# Patient Record
Sex: Female | Born: 1975
Health system: Southern US, Community
[De-identification: ages and names within clinical notes are randomized; demographics above are authoritative.]

## PROBLEM LIST (undated history)

## (undated) DIAGNOSIS — J309 Allergic rhinitis, unspecified: Secondary | ICD-10-CM

## (undated) DIAGNOSIS — F40240 Claustrophobia: Secondary | ICD-10-CM

## (undated) DIAGNOSIS — B019 Varicella without complication: Secondary | ICD-10-CM

## (undated) DIAGNOSIS — E119 Type 2 diabetes mellitus without complications: Secondary | ICD-10-CM

## (undated) DIAGNOSIS — E1169 Type 2 diabetes mellitus with other specified complication: Secondary | ICD-10-CM

## (undated) DIAGNOSIS — I10 Essential (primary) hypertension: Secondary | ICD-10-CM

## (undated) DIAGNOSIS — E785 Hyperlipidemia, unspecified: Secondary | ICD-10-CM

## (undated) DIAGNOSIS — K219 Gastro-esophageal reflux disease without esophagitis: Secondary | ICD-10-CM

## (undated) HISTORY — DX: Essential (primary) hypertension: I10

## (undated) HISTORY — DX: Type 2 diabetes mellitus with other specified complication: E11.69

## (undated) HISTORY — DX: Varicella without complication: B01.9

## (undated) HISTORY — DX: Gastro-esophageal reflux disease without esophagitis: K21.9

## (undated) HISTORY — DX: Allergic rhinitis, unspecified: J30.9

## (undated) HISTORY — DX: Type 2 diabetes mellitus without complications: E11.9

## (undated) HISTORY — DX: Hyperlipidemia, unspecified: E78.5

---

## 1995-02-13 DIAGNOSIS — O24419 Gestational diabetes mellitus in pregnancy, unspecified control: Secondary | ICD-10-CM

## 1996-06-05 DIAGNOSIS — O24419 Gestational diabetes mellitus in pregnancy, unspecified control: Secondary | ICD-10-CM

## 2006-09-15 ENCOUNTER — Ambulatory Visit: Payer: Self-pay | Admitting: Internal Medicine

## 2006-11-06 HISTORY — PX: LUMBAR DISC SURGERY: SHX700

## 2006-11-08 ENCOUNTER — Ambulatory Visit (HOSPITAL_COMMUNITY): Admission: RE | Admit: 2006-11-08 | Discharge: 2006-11-09 | Payer: Self-pay | Admitting: Neurological Surgery

## 2008-07-21 ENCOUNTER — Ambulatory Visit: Payer: Self-pay | Admitting: Internal Medicine

## 2008-11-06 HISTORY — PX: FOOT SURGERY: SHX648

## 2009-03-09 ENCOUNTER — Ambulatory Visit: Payer: Self-pay | Admitting: Podiatry

## 2010-02-24 ENCOUNTER — Ambulatory Visit: Payer: Self-pay | Admitting: Unknown Physician Specialty

## 2011-05-29 ENCOUNTER — Ambulatory Visit: Payer: Self-pay | Admitting: Internal Medicine

## 2011-06-07 ENCOUNTER — Ambulatory Visit: Payer: Self-pay | Admitting: Internal Medicine

## 2012-03-24 ENCOUNTER — Emergency Department: Payer: Self-pay | Admitting: *Deleted

## 2012-03-24 LAB — URINALYSIS, COMPLETE
Bacteria: NONE SEEN
Blood: NEGATIVE
Glucose,UR: >=500 mg/dL (ref 0–75)
Leukocyte Esterase: NEGATIVE
Ph: 5 (ref 4.5–8.0)
RBC,UR: 2 /HPF (ref 0–5)

## 2012-03-24 LAB — COMPREHENSIVE METABOLIC PANEL
Alkaline Phosphatase: 76 U/L (ref 50–136)
Anion Gap: 15 (ref 7–16)
BUN: 14 mg/dL (ref 7–18)
Bilirubin,Total: 0.6 mg/dL (ref 0.2–1.0)
Chloride: 104 mmol/L (ref 98–107)
Co2: 20 mmol/L — ABNORMAL LOW (ref 21–32)
Creatinine: 0.68 mg/dL (ref 0.60–1.30)
EGFR (Non-African Amer.): >60
SGOT(AST): 19 U/L (ref 15–37)
SGPT (ALT): 42 U/L

## 2012-03-24 LAB — CBC
HGB: 16.4 g/dL — ABNORMAL HIGH (ref 12.0–16.0)
Platelet: 346 10*3/uL (ref 150–440)
RBC: 5.49 10*6/uL — ABNORMAL HIGH (ref 3.80–5.20)

## 2013-01-21 ENCOUNTER — Ambulatory Visit: Payer: Self-pay | Admitting: Internal Medicine

## 2013-01-24 ENCOUNTER — Ambulatory Visit: Payer: Self-pay | Admitting: Internal Medicine

## 2013-02-04 ENCOUNTER — Ambulatory Visit: Payer: Self-pay | Admitting: Internal Medicine

## 2013-03-11 ENCOUNTER — Ambulatory Visit: Payer: Self-pay | Admitting: Internal Medicine

## 2013-03-25 ENCOUNTER — Encounter: Payer: Self-pay | Admitting: General Practice

## 2013-04-06 ENCOUNTER — Ambulatory Visit: Payer: Self-pay | Admitting: Internal Medicine

## 2013-04-06 ENCOUNTER — Encounter: Payer: Self-pay | Admitting: General Practice

## 2015-02-15 ENCOUNTER — Encounter: Payer: Self-pay | Admitting: *Deleted

## 2015-09-27 ENCOUNTER — Other Ambulatory Visit: Payer: Self-pay | Admitting: Unknown Physician Specialty

## 2015-09-27 ENCOUNTER — Inpatient Hospital Stay
Admission: RE | Admit: 2015-09-27 | Discharge: 2015-09-27 | Disposition: A | Discharge status: Home / Self Care | Payer: Self-pay | Source: Ambulatory Visit | Attending: *Deleted | Admitting: *Deleted

## 2015-09-27 ENCOUNTER — Other Ambulatory Visit: Payer: Self-pay | Admitting: *Deleted

## 2015-09-27 DIAGNOSIS — R928 Other abnormal and inconclusive findings on diagnostic imaging of breast: Secondary | ICD-10-CM

## 2015-09-27 DIAGNOSIS — Z9289 Personal history of other medical treatment: Secondary | ICD-10-CM

## 2015-10-13 ENCOUNTER — Ambulatory Visit
Admission: RE | Admit: 2015-10-13 | Discharge: 2015-10-13 | Disposition: A | Discharge status: Home / Self Care | Payer: 59 | Source: Ambulatory Visit | Attending: Unknown Physician Specialty | Admitting: Unknown Physician Specialty

## 2015-10-13 DIAGNOSIS — N63 Unspecified lump in breast: Secondary | ICD-10-CM | POA: Diagnosis not present

## 2015-10-13 DIAGNOSIS — R928 Other abnormal and inconclusive findings on diagnostic imaging of breast: Secondary | ICD-10-CM

## 2015-12-20 ENCOUNTER — Ambulatory Visit (INDEPENDENT_AMBULATORY_CARE_PROVIDER_SITE_OTHER): Payer: 59 | Admitting: Family Medicine

## 2015-12-20 ENCOUNTER — Telehealth: Payer: Self-pay | Admitting: *Deleted

## 2015-12-20 ENCOUNTER — Encounter: Payer: Self-pay | Admitting: Family Medicine

## 2015-12-20 VITALS — BP 126/84 | HR 99 | Temp 98.2°F | Ht 64.5 in | Wt 157.4 lb

## 2015-12-20 DIAGNOSIS — D229 Melanocytic nevi, unspecified: Secondary | ICD-10-CM | POA: Diagnosis not present

## 2015-12-20 DIAGNOSIS — J069 Acute upper respiratory infection, unspecified: Secondary | ICD-10-CM

## 2015-12-20 DIAGNOSIS — R928 Other abnormal and inconclusive findings on diagnostic imaging of breast: Secondary | ICD-10-CM | POA: Diagnosis not present

## 2015-12-20 DIAGNOSIS — E119 Type 2 diabetes mellitus without complications: Secondary | ICD-10-CM

## 2015-12-20 MED ORDER — METFORMIN HCL 1000 MG PO TABS: 1000.0000 mg | ORAL_TABLET | Freq: Two times a day (BID) | ORAL | Status: DC

## 2015-12-20 MED ORDER — PANTOPRAZOLE SODIUM 40 MG PO TBEC: 40.0000 mg | DELAYED_RELEASE_TABLET | Freq: Every day | ORAL | Status: DC

## 2015-12-20 MED ORDER — GLIPIZIDE 10 MG PO TABS: 10.0000 mg | ORAL_TABLET | Freq: Two times a day (BID) | ORAL | Status: AC

## 2015-12-20 MED ORDER — LISINOPRIL 10 MG PO TABS: 10.0000 mg | ORAL_TABLET | Freq: Every day | ORAL | Status: DC

## 2015-12-20 NOTE — Assessment & Plan Note (Signed)
Nevus on abdomen is benign appearing. Advised patient of this. She'll continue to monitor. If it changes she will let us know.

## 2015-12-20 NOTE — Telephone Encounter (Signed)
Resent correct amount

## 2015-12-20 NOTE — Progress Notes (Signed)
Pre visit review using our clinic review tool, if applicable. No additional management support is needed unless otherwise documented below in the visit note. 

## 2015-12-20 NOTE — Patient Instructions (Signed)
Nice to meet you. Please continue to check her blood sugars fasting and with one meal per day. We will check her A1c tomorrow for fasting labs with a cholesterol panel as well. If your blood sugar runs 350 or greater please let us know immediately. You can do Zyrtec, Flonase, Tylenol, or ibuprofen and fluids for your upper respiratory infection. Please monitor the mole on her stomach and if it changes at all please let us know immediately.

## 2015-12-20 NOTE — Assessment & Plan Note (Signed)
Home CBGs not well controlled. Currently taking oral medications glipizide and metformin. His been unable to tolerate other orals and several injectables. We will check an A1c and a CMP today. We will base our decision on what to try next off of her A1c. Discussed that insulin would be a very real possibility for her. She will continue to check her CBGs at home.

## 2015-12-20 NOTE — Telephone Encounter (Signed)
[facility] pharmacy requested clarification for patients Rx for metformin (GLUCOPHAGE) 1000 MG tablet  Rx was sent over for 2x a day 30 count per fill Pharmacy  stated it will need to be 2x a day 60 count per fill

## 2015-12-20 NOTE — Progress Notes (Signed)
Patient ID: [name], female   DOB: 26-Aug-1976, 40 y.o.   MRN: [id number]  Tonya Rumps, MD Phone: [phone]  Tonya Sampson is a 40 y.o. female who presents today for new patient visit.  Viral upper respiratory infection: Patient notes starting yesterday with sore throat, sneezing, nonproductive cough, and nasal congestion. No fevers, chest pain, or shortness of breath. Son with similar symptoms. No medications tried.  Mole: Patient notes mole on her right anterior abdomen the last 6 months. Notes it is reddish in color. Has not changed in size or shape.  DIABETES Disease Monitoring: Blood Sugar ranges-180s-200s, if does not take any medication will go up to 460 Polyuria/phagia/dipsia- notes polyuria and polydipsia       Medications: Compliance- glipizide 10 mg twice a day and metformin 1000 mg twice a day Hypoglycemic symptoms- no  patient notes she has tried multiple injectable medications in the past. Has tried bidureon and Victoza though had nausea with both of these. Invokana gave her yeast infection. She thinks she has been on Januvia previously as well. Last A1c was 6 months ago she notes it was somewhere between 9 and 10.  Patient also notes she had a recent mammogram that had an abnormality in the lateral aspect of her right breast that was felt to be cystic in nature and there is a plan in place to recheck a mammogram in 6 months.   Active Ambulatory Problems    Diagnosis Date Noted  . Viral upper respiratory infection 12/20/2015  . Diabetes ([place]) 12/20/2015  . Benign nevus 12/20/2015  . Abnormal mammogram 12/20/2015   Resolved Ambulatory Problems    Diagnosis Date Noted  . No Resolved Ambulatory Problems   Past Medical History  Diagnosis Date  . Chickenpox   . GERD (gastroesophageal reflux disease)   . Allergic rhinitis     Family History  Problem Relation Age of Onset  . Alcoholism    . Arthritis    . Colon cancer    . Uterine cancer    .  Hyperlipidemia    . Heart disease    . Kidney disease    . Hypertension    . Diabetes      Social History   Social History  . Marital Status: Married    Spouse Name: N/A  . Number of Children: N/A  . Years of Education: N/A   Occupational History  . Not on file.   Social History Main Topics  . Smoking status: Never Smoker   . Smokeless tobacco: Not on file  . Alcohol Use: No  . Drug Use: No  . Sexual Activity: Not on file   Other Topics Concern  . Not on file   Social History Narrative    ROS   General:  Negative for nexplained weight loss, fever Skin: Positive for new or changing mole, negative sore that won't heal HEENT: Negative for trouble hearing, trouble seeing, ringing in ears, mouth sores, hoarseness, change in voice, dysphagia. CV:  Negative for chest pain, dyspnea, edema, palpitations Resp: Negative for cough, dyspnea, hemoptysis GI: Negative for nausea, vomiting, diarrhea, constipation, abdominal pain, melena, hematochezia. GU: Positive for frequent urination, lump in breast, Negative for dysuria, incontinence, urinary hesitance, hematuria, vaginal or penile discharge, polyuria, sexual difficulty MSK: Negative for muscle cramps or aches, joint pain or swelling Neuro: Negative for headaches, weakness, numbness, dizziness, passing out/fainting Psych: Negative for depression, anxiety, memory problems  Objective  Physical Exam Filed Vitals:   12/20/15 1428  BP: 126/84  Pulse: 99  Temp: 98.2 F (36.8 C)    BP Readings from Last 3 Encounters:  12/20/15 126/84   Wt Readings from Last 3 Encounters:  12/20/15 157 lb 6.4 oz (71.396 kg)    Physical Exam  Constitutional: She is well-developed, well-nourished, and in no distress.  HENT:  Head: Normocephalic and atraumatic.  Right Ear: External ear normal.  Left Ear: External ear normal.  Mouth/Throat: Oropharynx is clear and moist. No oropharyngeal exudate.  Normal TMs bilaterally  Eyes:  Conjunctivae are normal. Pupils are equal, round, and reactive to light.  Neck: Neck supple.  Cardiovascular: Normal rate, regular rhythm and normal heart sounds.  Exam reveals no gallop and no friction rub.   No murmur heard. Pulmonary/Chest: Effort normal and breath sounds normal. No respiratory distress. She has no wheezes. She has no rales.  Abdominal: Soft. Bowel sounds are normal. She exhibits no distension. There is no tenderness. There is no rebound and no guarding.  Musculoskeletal: She exhibits no edema.  Lymphadenopathy:    She has no cervical adenopathy.  Neurological: She is alert. Gait normal.  Skin: Skin is warm and dry. She is not diaphoretic.  Right anterior lower abdomen with 3 mm benign-appearing nevus with mild red/brown hue, no irregularity, uniform color  Psychiatric: Mood normal.   bilateral feet warm and well-perfused, no sores or ulcerations, monofilament testing intact   Assessment/Plan:   Viral upper respiratory infection Symptoms most consistent with viral upper respiratory infection. Discussed supportive care. She was advised on Flonase and Claritin. She'll continue to monitor. Given return precautions.  Diabetes ([place]) Home CBGs not well controlled. Currently taking oral medications glipizide and metformin. His been unable to tolerate other orals and several injectables. We will check an A1c and a CMP today. We will base our decision on what to try next off of her A1c. Discussed that insulin would be a very real possibility for her. She will continue to check her CBGs at home.  Benign nevus Nevus on abdomen is benign appearing. Advised patient of this. She'll continue to monitor. If it changes she will let us know.  Abnormal mammogram Mammogram findings felt to be cysts based on review of radiology read. Plan for mammography in 6 months to reevaluate.    Orders Placed This Encounter  Procedures  . Lipid Profile    Standing Status: Future     Number of  Occurrences:      Standing Expiration Date: 12/19/2016  . Comp Met (CMET)    Standing Status: Future     Number of Occurrences:      Standing Expiration Date: 12/19/2016  . HgB A1c    Standing Status: Future     Number of Occurrences:      Standing Expiration Date: 12/19/2016     Tonya Sampson

## 2015-12-20 NOTE — Assessment & Plan Note (Signed)
Mammogram findings felt to be cysts based on review of radiology read. Plan for mammography in 6 months to reevaluate.

## 2015-12-20 NOTE — Assessment & Plan Note (Signed)
Symptoms most consistent with viral upper respiratory infection. Discussed supportive care. She was advised on Flonase and Claritin. She'll continue to monitor. Given return precautions.

## 2015-12-28 ENCOUNTER — Other Ambulatory Visit: Payer: 59

## 2016-01-19 DIAGNOSIS — I10 Essential (primary) hypertension: Secondary | ICD-10-CM | POA: Diagnosis not present

## 2016-01-19 DIAGNOSIS — E119 Type 2 diabetes mellitus without complications: Secondary | ICD-10-CM | POA: Diagnosis not present

## 2016-01-19 DIAGNOSIS — Z0000 Encounter for general adult medical examination without abnormal findings: Secondary | ICD-10-CM | POA: Diagnosis not present

## 2016-01-20 ENCOUNTER — Ambulatory Visit: Payer: 59 | Admitting: Family Medicine

## 2016-01-26 DIAGNOSIS — E782 Mixed hyperlipidemia: Secondary | ICD-10-CM | POA: Diagnosis not present

## 2016-01-26 DIAGNOSIS — I10 Essential (primary) hypertension: Secondary | ICD-10-CM | POA: Diagnosis not present

## 2016-01-26 DIAGNOSIS — Z794 Long term (current) use of insulin: Secondary | ICD-10-CM | POA: Diagnosis not present

## 2016-01-26 DIAGNOSIS — E119 Type 2 diabetes mellitus without complications: Secondary | ICD-10-CM | POA: Diagnosis not present

## 2016-04-27 DIAGNOSIS — Z794 Long term (current) use of insulin: Secondary | ICD-10-CM | POA: Diagnosis not present

## 2016-04-27 DIAGNOSIS — E119 Type 2 diabetes mellitus without complications: Secondary | ICD-10-CM | POA: Diagnosis not present

## 2016-04-27 DIAGNOSIS — I10 Essential (primary) hypertension: Secondary | ICD-10-CM | POA: Diagnosis not present

## 2016-07-28 ENCOUNTER — Other Ambulatory Visit: Payer: Self-pay | Admitting: Certified Nurse Midwife

## 2016-07-28 DIAGNOSIS — N6310 Unspecified lump in the right breast, unspecified quadrant: Secondary | ICD-10-CM

## 2016-08-18 DIAGNOSIS — Z0000 Encounter for general adult medical examination without abnormal findings: Secondary | ICD-10-CM | POA: Diagnosis not present

## 2016-08-24 DIAGNOSIS — K219 Gastro-esophageal reflux disease without esophagitis: Secondary | ICD-10-CM | POA: Diagnosis not present

## 2016-08-24 DIAGNOSIS — E119 Type 2 diabetes mellitus without complications: Secondary | ICD-10-CM | POA: Diagnosis not present

## 2016-08-24 DIAGNOSIS — Z794 Long term (current) use of insulin: Secondary | ICD-10-CM | POA: Diagnosis not present

## 2016-08-24 DIAGNOSIS — I10 Essential (primary) hypertension: Secondary | ICD-10-CM | POA: Diagnosis not present

## 2016-08-24 DIAGNOSIS — K029 Dental caries, unspecified: Secondary | ICD-10-CM | POA: Diagnosis not present

## 2016-09-21 DIAGNOSIS — Z01419 Encounter for gynecological examination (general) (routine) without abnormal findings: Secondary | ICD-10-CM | POA: Diagnosis not present

## 2016-09-21 DIAGNOSIS — E113293 Type 2 diabetes mellitus with mild nonproliferative diabetic retinopathy without macular edema, bilateral: Secondary | ICD-10-CM | POA: Diagnosis not present

## 2016-09-21 DIAGNOSIS — N920 Excessive and frequent menstruation with regular cycle: Secondary | ICD-10-CM | POA: Diagnosis not present

## 2016-09-21 DIAGNOSIS — Z124 Encounter for screening for malignant neoplasm of cervix: Secondary | ICD-10-CM | POA: Diagnosis not present

## 2016-09-27 ENCOUNTER — Ambulatory Visit
Admission: RE | Admit: 2016-09-27 | Discharge: 2016-09-27 | Disposition: A | Discharge status: Home / Self Care | Payer: 59 | Source: Ambulatory Visit | Attending: Certified Nurse Midwife | Admitting: Certified Nurse Midwife

## 2016-09-27 DIAGNOSIS — R928 Other abnormal and inconclusive findings on diagnostic imaging of breast: Secondary | ICD-10-CM | POA: Diagnosis not present

## 2016-09-27 DIAGNOSIS — N6489 Other specified disorders of breast: Secondary | ICD-10-CM | POA: Diagnosis not present

## 2016-09-27 DIAGNOSIS — N6310 Unspecified lump in the right breast, unspecified quadrant: Secondary | ICD-10-CM

## 2016-12-22 DIAGNOSIS — I10 Essential (primary) hypertension: Secondary | ICD-10-CM | POA: Diagnosis not present

## 2016-12-22 DIAGNOSIS — K029 Dental caries, unspecified: Secondary | ICD-10-CM | POA: Diagnosis not present

## 2016-12-22 DIAGNOSIS — E119 Type 2 diabetes mellitus without complications: Secondary | ICD-10-CM | POA: Diagnosis not present

## 2016-12-22 DIAGNOSIS — Z794 Long term (current) use of insulin: Secondary | ICD-10-CM | POA: Diagnosis not present

## 2016-12-26 DIAGNOSIS — I10 Essential (primary) hypertension: Secondary | ICD-10-CM | POA: Diagnosis not present

## 2016-12-26 DIAGNOSIS — K219 Gastro-esophageal reflux disease without esophagitis: Secondary | ICD-10-CM | POA: Diagnosis not present

## 2016-12-26 DIAGNOSIS — K029 Dental caries, unspecified: Secondary | ICD-10-CM | POA: Diagnosis not present

## 2016-12-26 DIAGNOSIS — E119 Type 2 diabetes mellitus without complications: Secondary | ICD-10-CM | POA: Diagnosis not present

## 2017-02-10 IMAGING — MG MM DIGITAL DIAGNOSTIC BILAT W/ TOMO W/ CAD
8 of 13 series · 8 of 29 positions shown · non-contrast
Comparison: Previous exam(s).

CLINICAL DATA: Delayed follow-up for a probably benign right breast
mass.

EXAM:
2D DIGITAL DIAGNOSTIC BILATERAL MAMMOGRAM WITH CAD AND ADJUNCT TOMO
ULTRASOUND RIGHT BREAST

[R MLO (1 of 2)]
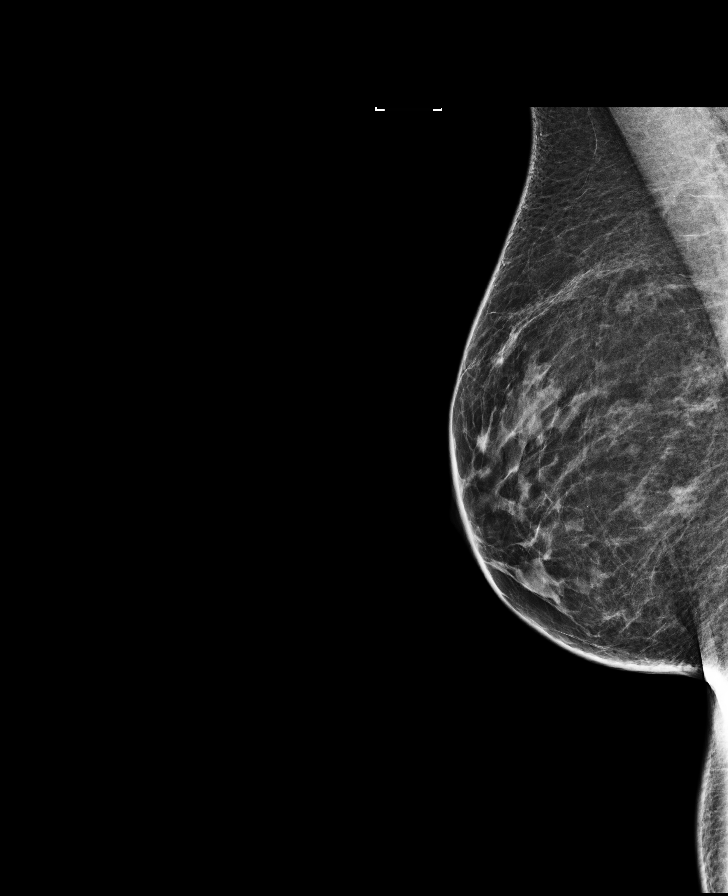

[L CC]
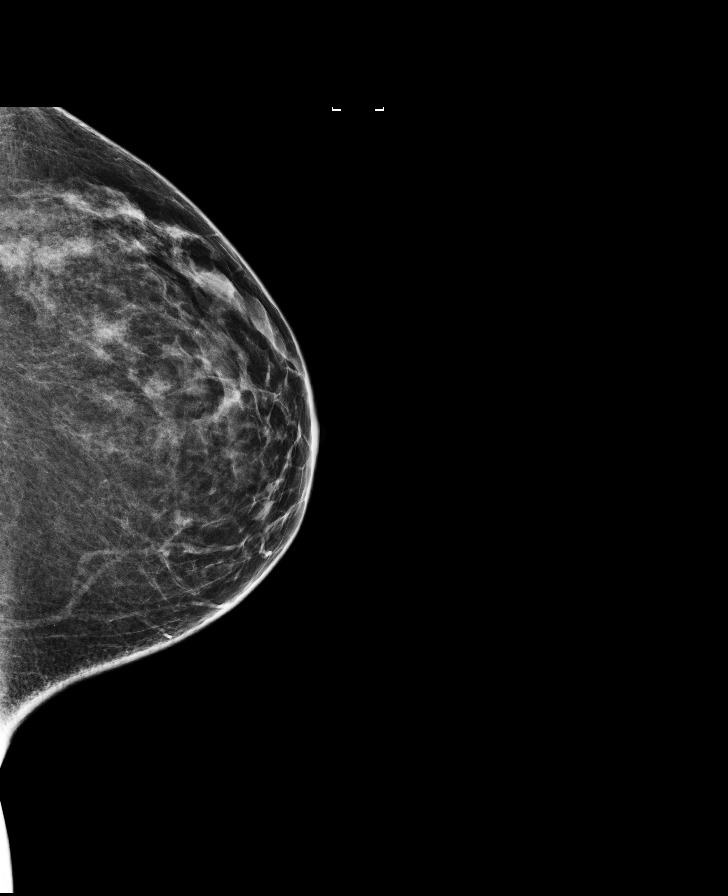

[L CC synth-2D]
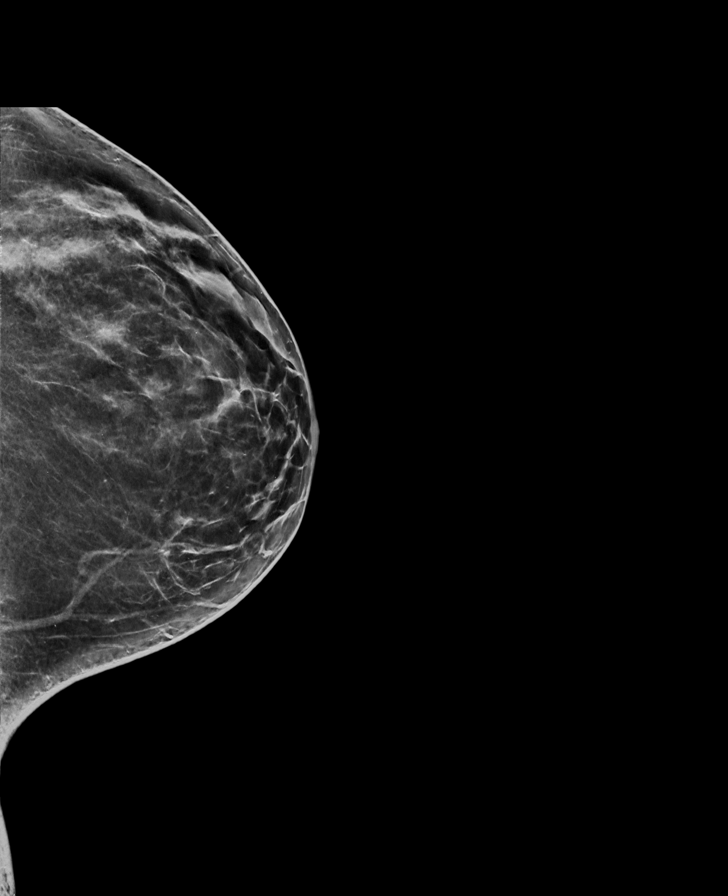

[R CC synth-2D]
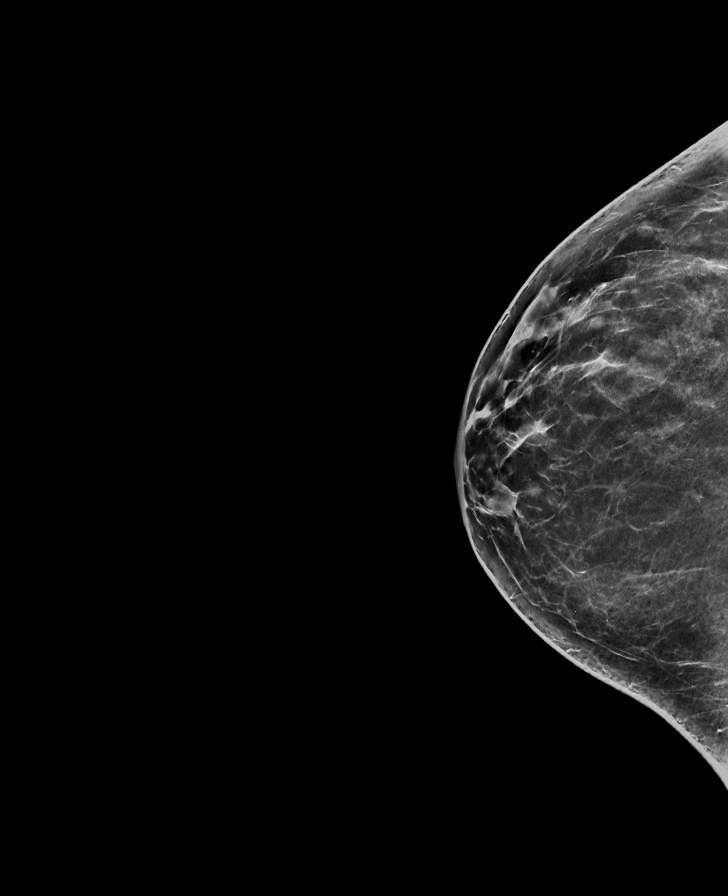

[R MLO (2 of 2)]
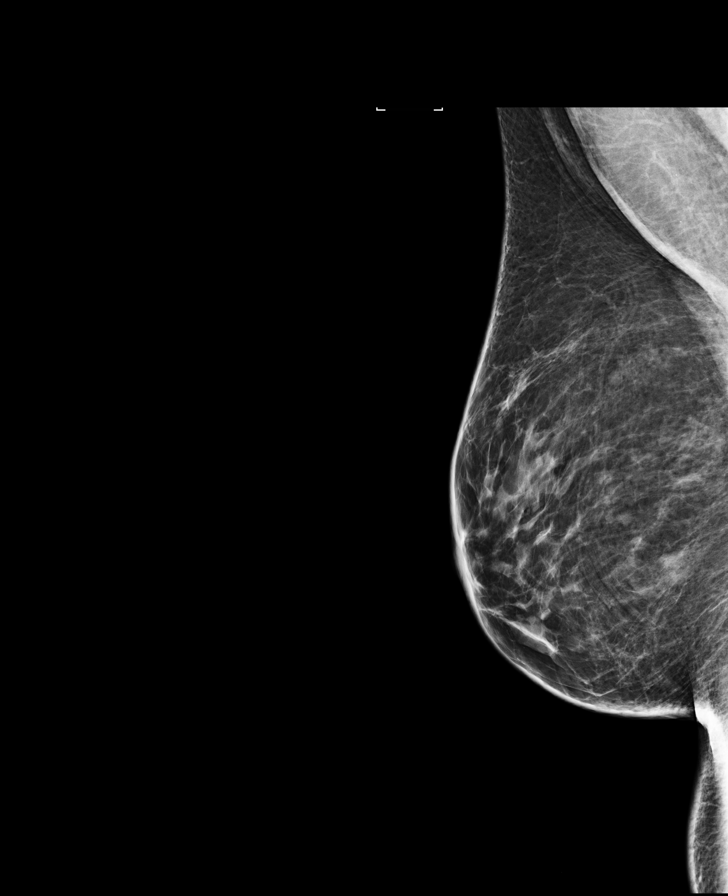

[R MLO synth-2D]
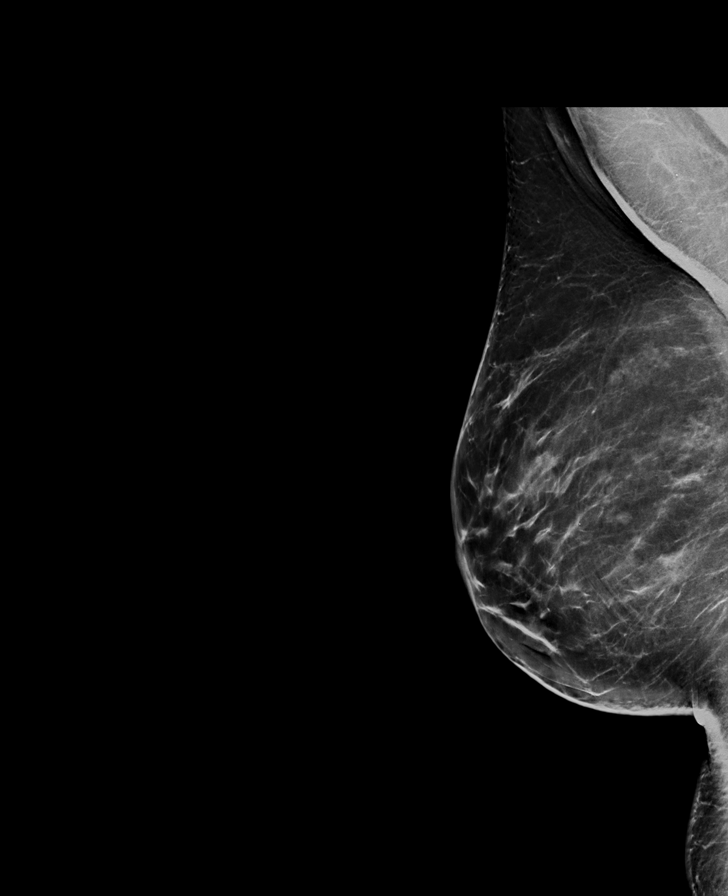

[R CC]
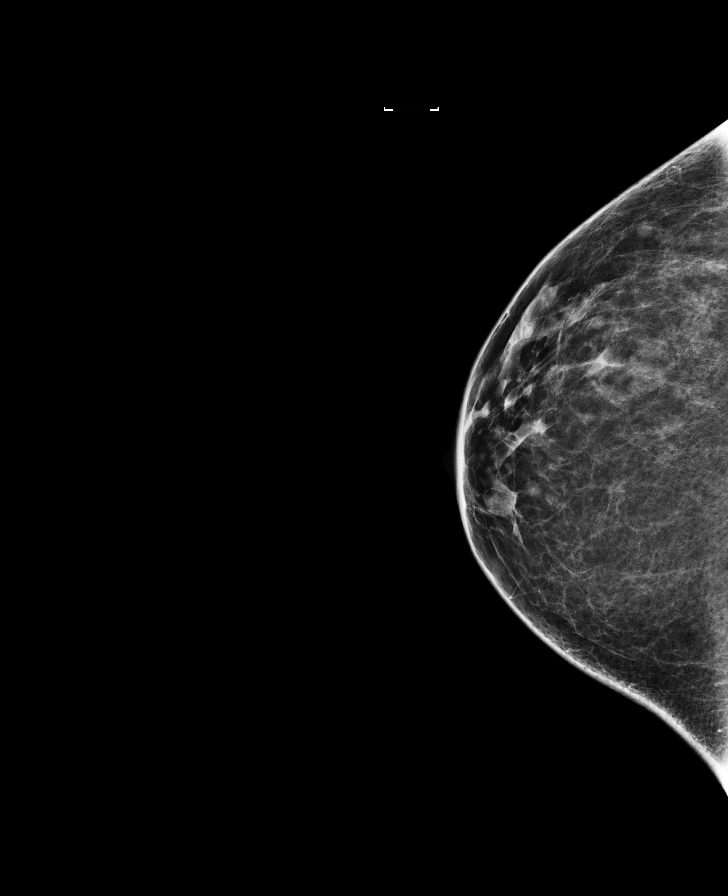

[L MLO]
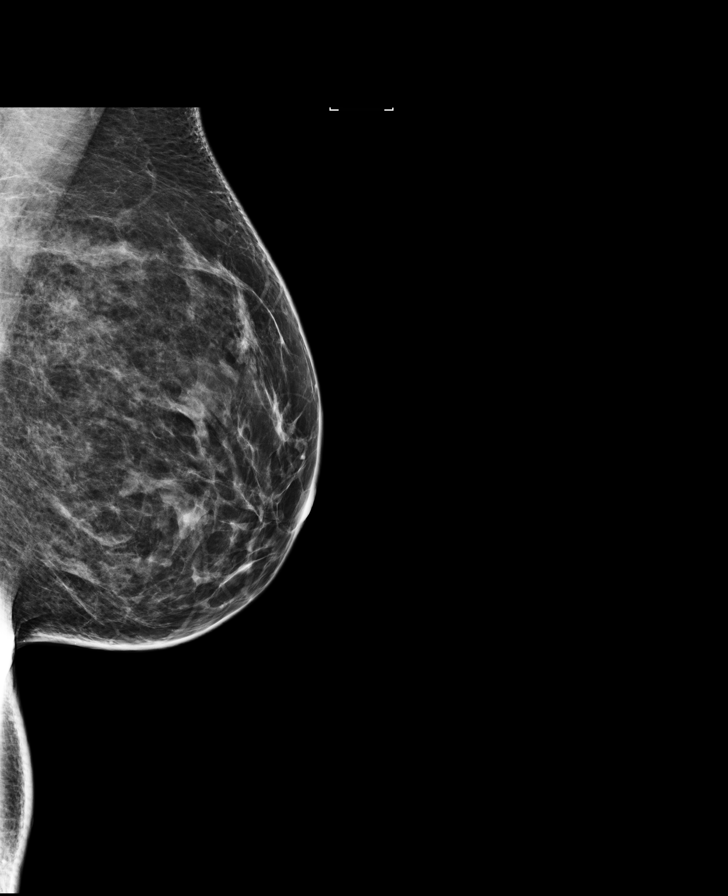

[8 of 29 positions shown; findings below may reference images not displayed]

ACR Breast Density Category c: The breast tissue is heterogeneously
dense, which may obscure small masses.
FINDINGS: The previously noted 5 mm oval, circumscribed, low-density mass
within the lower, outer right breast is not significantly changed
from the 09/21/2015 exam. No new or suspicious abnormality is
identified within either breast.

Mammographic images were processed with CAD.

Targeted ultrasound is performed demonstrating an oval,
circumscribed, hypoechoic mass at 8 o'clock, 6 cm from the nipple
measuring 5 x 2 x 4 mm, similar to slightly smaller in size given
differences in measurement technique. This mass is again thought to
likely represent a small complicated cyst.
IMPRESSION: Probably benign right breast finding.

RECOMMENDATION:
Bilateral diagnostic mammogram and possible right breast ultrasound
in 1 year.

I have discussed the findings and recommendations with the patient.
Results were also provided in writing at the conclusion of the
visit. If applicable, a reminder letter will be sent to the patient
regarding the next appointment.

BI-RADS CATEGORY  3: Probably benign.

## 2017-03-21 DIAGNOSIS — H40053 Ocular hypertension, bilateral: Secondary | ICD-10-CM | POA: Diagnosis not present

## 2017-03-27 DIAGNOSIS — E113293 Type 2 diabetes mellitus with mild nonproliferative diabetic retinopathy without macular edema, bilateral: Secondary | ICD-10-CM | POA: Diagnosis not present

## 2017-05-28 ENCOUNTER — Other Ambulatory Visit: Payer: Self-pay

## 2017-05-28 VITALS — BP 118/70 | Ht 63.0 in | Wt 159.3 lb

## 2017-05-28 DIAGNOSIS — Z794 Long term (current) use of insulin: Principal | ICD-10-CM

## 2017-05-28 DIAGNOSIS — E1165 Type 2 diabetes mellitus with hyperglycemia: Secondary | ICD-10-CM

## 2017-05-28 NOTE — Patient Outreach (Signed)
[facility]) Care Management  [place]  05/28/2017   Tonya Sampson November 21, 1975 [id number]  Subjective: Met with patient for her Link to Wellness diabetes visit through [facility].  Her last A1C was 10.4% in February 2018.  She has had to cancel follow up visits with her MD because she forgets to get her labs drawn before the appointment.  She is exercising 2x/week for 15-20 minutes.  She is taking her medications as ordered.  She denies SOB or chest pain. She reports fasting blood sugars 113-119 mg/dl (this morning's CBG was 155m/dl) and she tells me that her after supper blood sugars are 200 mg/dl.  She is taking 4-6 units of rapid acting insulin with one meal per day.   Objective:  Vitals:   05/28/17 1506  BP: 118/70  Weight: 159 lb 4.8 oz (72.3 kg)  Height: 1.6 m (5' 3" )    Encounter Medications:  Outpatient Encounter Prescriptions as of 05/28/2017  Medication Sig Note  . glipiZIDE (GLUCOTROL) 10 MG tablet Take 1 tablet (10 mg total) by mouth 2 (two) times daily before a meal.   . insulin detemir (LEVEMIR) 100 UNIT/ML injection Inject 30 Units into the skin at bedtime.   . liraglutide 18 MG/3ML SOPN Inject 1.8 mg into the skin daily.   .Marland Kitchenlisinopril (PRINIVIL,ZESTRIL) 10 MG tablet Take 1 tablet (10 mg total) by mouth daily.   . metFORMIN (GLUCOPHAGE) 1000 MG tablet Take 1 tablet (1,000 mg total) by mouth 2 (two) times daily with a meal.   . ondansetron (ZOFRAN) 4 MG tablet Take 4 mg by mouth every 8 (eight) hours as needed.   . pantoprazole (PROTONIX) 40 MG tablet Take 1 tablet (40 mg total) by mouth daily.   . Promethazine HCl (PHENERGAN PO) Take 25 mg by mouth every 6 (six) hours.   .Marland Kitchenglucose blood (ONE TOUCH ULTRA TEST) test strip  12/20/2015: Received from: [facility]  No facility-administered encounter medications on file as of 05/28/2017.     Functional Status:  In your present state of health, do you have any difficulty  performing the following activities: 05/28/2017  Hearing? N  Vision? N  Difficulty concentrating or making decisions? N  Walking or climbing stairs? N  Dressing or bathing? N  Doing errands, shopping? N  Some recent data might be hidden    Fall/Depression Screening: Fall Risk  05/28/2017  Falls in the past year? No   PHQ 2/9 Scores 05/28/2017  PHQ - 2 Score 0    Assessment: Tonya Sampson and I discussed the importance of taking care of her diabetes for long term prevention of diabetes complications.  She admits to having difficulty making lifestyle changes because "I don't feel sick so it's easy to "hide" it and not take care of myself".  We reviewed meal planning and the improtance of consistent exercise- aiming for 150 minutes per week. She wants to lose weight so encouraged weight watchers or calorie counting to achieve this- I have encouraged her to eat protein with carbohydrates for blood sugar management.    Plan:  [facility]     Most Recent Value  Care Plan Problem One  Elevated A1C greater than 7% as recommended by ADA  Role Documenting the Problem One  Care Management CJunction Cityfor Problem One  Active  [facility]   Patient will obtain an A1C less than 10.4% when she sees the MD within  3 months  THN Long Term Goal Start Date  05/28/17  Interventions for Problem One Long Term Goal  1. exercise 3x/week for 30 minutes after eating  2. take medications as ordered      Tonya Sampson needs to; 1. reschedule MD visit before 07/02/17 2. Try a non-medicated corn pad on her right baby toe where it is rubbing on her shoe 3. Call me with the name of her mealtime insulin (she doesn't know what she's taking) 4. Follow up with me on 07/02/17 5. Exercise 3x/week for 30 minutes after eating a meal 6. Complete EMMI education   Gentry Fitz, RN, [place], [place], [place] Diabetes Coordinator- Link To Wellness Direct Dial:  [phone]  Fax:   [phone] E-mail: [email or web address] [address], [place], [place]  [address]

## 2017-06-05 DIAGNOSIS — I10 Essential (primary) hypertension: Secondary | ICD-10-CM | POA: Diagnosis not present

## 2017-06-05 DIAGNOSIS — Z794 Long term (current) use of insulin: Secondary | ICD-10-CM | POA: Diagnosis not present

## 2017-06-05 DIAGNOSIS — E119 Type 2 diabetes mellitus without complications: Secondary | ICD-10-CM | POA: Diagnosis not present

## 2017-07-02 ENCOUNTER — Other Ambulatory Visit: Payer: Self-pay

## 2017-07-02 VITALS — BP 122/80 | Wt 158.9 lb

## 2017-07-02 DIAGNOSIS — Z794 Long term (current) use of insulin: Principal | ICD-10-CM

## 2017-07-02 DIAGNOSIS — E1165 Type 2 diabetes mellitus with hyperglycemia: Secondary | ICD-10-CM

## 2017-07-02 NOTE — Patient Outreach (Signed)
[place] [facility]) Care Management  [place]  07/02/2017   Tonya Sampson 27-Jul-1976 [id number]  Subjective: Met with Jenny Reichmann for her regularly scheduled Link to Wellness diabetes visit. Her last A1C was 9.3%, down from 10.4% in February.  She attributes the reduction in the A1C to her moving the Levemir to the hs because she was occasionally forgetting to take it in the morning.  She reports that she only checks blood sugars in the morning (150-126m/dl). She continue to take Levemir 30 units qhs. She tells me she was instructed to increase the Levemir 1 unit each day until the fasting blood sugars are less than 1343mdl but she has not done this. She is not taking any rapid acting insulin.  She is consistently taking her Victoza, Metformin and Glipizide. She denies chest pain or SOB but complains of chronic blurry vision. She has purchased new shoes and has decreased redness on her right baby toe.   Objective:  Vitals:   07/02/17 1710  BP: 122/80  Weight: 158 lb 14.4 oz (72.1 kg)     Encounter Medications:  Outpatient Encounter Prescriptions as of 07/02/2017  Medication Sig Note  . glipiZIDE (GLUCOTROL) 10 MG tablet Take 1 tablet (10 mg total) by mouth 2 (two) times daily before a meal.   . insulin detemir (LEVEMIR) 100 UNIT/ML injection Inject 30 Units into the skin at bedtime.   . liraglutide 18 MG/3ML SOPN Inject 1.8 mg into the skin daily.   . Marland Kitchenisinopril (PRINIVIL,ZESTRIL) 10 MG tablet Take 1 tablet (10 mg total) by mouth daily.   . metFORMIN (GLUCOPHAGE) 1000 MG tablet Take 1 tablet (1,000 mg total) by mouth 2 (two) times daily with a meal.   . pantoprazole (PROTONIX) 40 MG tablet Take 1 tablet (40 mg total) by mouth daily. (Patient taking differently: Take 40 mg by mouth 2 (two) times daily. )   . glucose blood (ONE TOUCH ULTRA TEST) test strip  12/20/2015: [facility]. ondansetron (ZOFRAN) 4 MG tablet Take 4 mg by mouth every 8  (eight) hours as needed.   . Promethazine HCl (PHENERGAN PO) Take 25 mg by mouth every 6 (six) hours.    No facility-administered encounter medications on file as of 07/02/2017.     Functional Status:  In your present state of health, do you have any difficulty performing the following activities: 05/28/2017  Hearing? N  Vision? N  Comment wears reading glasses- progressive glasses just obtained (July 2018)  Difficulty concentrating or making decisions? N  Walking or climbing stairs? N  Dressing or bathing? N  Doing errands, shopping? N  Some recent data might be hidden    Fall/Depression Screening: Fall Risk  05/28/2017  Falls in the past year? No   PHQ 2/9 Scores 05/28/2017  PHQ - 2 Score 0    Assessment: Despite setting goals at our last visit, CiSissias been unable to complete them. She is only walking 2x/week for 15 minutes, has not scheduled her doctor's visit, has not checked blood sugars more than once a day, & did not bring her meter with her for me to assess blood sugars. She is taking her Glipizide first thing in the morning, then driving an hour before eating anything- we discussed the potential for low blood sugars and I have asked her to keep glucose tabs with her at all times and a package of peanut butter crackers- she will not eat before she gets in the car  in the morning.  She complains of having to "pick herself" too often if she were to check her blood sugar 3 times a day and then give herself rapid acting insulin (neither of which she is doing).  I have asked her to focus on getting her fasting blood sugars down as a start to better blood sugar management .  Tejal may do better with the [facility] sensor and reader as a means of checking blood sugars- this would allow her to see her blood sugars before meal.  This would decrease the number of "pickings" and once we get her fasting blood sugars down, we could introduce the rapid acting insulin back in.   I reviewed her  insulin administration technique.   Plan:  [facility] CM Care Plan Problem One     Most Recent Value  Care Plan Problem One  Elevated A1C greater than 7% as recommended by ADA  Role Documenting the Problem One  Care Management Coordinator  Care Plan for Problem One  Active  [facility] Long Term Goal   Patient will obtain an A1C less than 10.4% when she sees the MD within 3 months  THN Long Term Goal Start Date  07/02/17  Interventions for Problem One Long Term Goal  take Levemir as directed increasing 1 unit every day until fasting blood sugars are below 115m/dl as directed by MD  2. put glucose tablets and peanut butter crackers in your car and in your purse to treat low blood sugars     I will follow up with her in a couple of weeks.    JGentry Fitz RN, BA, [place]iber[place] CAftonDirect Dial:  [phone] Fax:  [phone]-mail: [email or web address] [address] [place]  [address]

## 2017-07-16 ENCOUNTER — Other Ambulatory Visit: Payer: Self-pay

## 2017-07-16 NOTE — Patient Outreach (Signed)
[facility]) Care Management  07/16/2017  Tonya Sampson 07-Sep-1976 [id number]   Called patient- was unable to reach her by phone.  I have left a voice message for her to call me on 07/18/17.  I see she has a scheduled visit with the MD tomorrow.  I look forward to hearing from her regarding the visit and to hear who initiated the visit.    Gentry Fitz, RN, BA, [place], [place] Direct Dial:  [phone]  Fax:  [phone] E-mail: [email or web address] [address], [place], [place]  [address]

## 2017-07-17 DIAGNOSIS — E119 Type 2 diabetes mellitus without complications: Secondary | ICD-10-CM | POA: Diagnosis not present

## 2017-07-17 DIAGNOSIS — I10 Essential (primary) hypertension: Secondary | ICD-10-CM | POA: Diagnosis not present

## 2017-07-17 DIAGNOSIS — K219 Gastro-esophageal reflux disease without esophagitis: Secondary | ICD-10-CM | POA: Diagnosis not present

## 2017-09-12 NOTE — Patient Outreach (Signed)
[facility]) Care Management  09/12/2017  Tonya Sampson May 08, 1976 [id number]  [name] will transition from Link to Wellness, diabetes management program and transition to Active Health Management in 2019.    Gentry Fitz, RN, BA, [place], [place] Direct Dial:  [phone]  Fax:  [phone] E-mail: [email or web address] [address] [place], [place]  [address]

## 2017-10-31 DIAGNOSIS — E113293 Type 2 diabetes mellitus with mild nonproliferative diabetic retinopathy without macular edema, bilateral: Secondary | ICD-10-CM | POA: Diagnosis not present

## 2017-11-09 DIAGNOSIS — I10 Essential (primary) hypertension: Secondary | ICD-10-CM | POA: Diagnosis not present

## 2017-11-09 DIAGNOSIS — E119 Type 2 diabetes mellitus without complications: Secondary | ICD-10-CM | POA: Diagnosis not present

## 2017-11-09 DIAGNOSIS — K219 Gastro-esophageal reflux disease without esophagitis: Secondary | ICD-10-CM | POA: Diagnosis not present

## 2017-11-16 DIAGNOSIS — E119 Type 2 diabetes mellitus without complications: Secondary | ICD-10-CM | POA: Diagnosis not present

## 2017-11-16 DIAGNOSIS — K219 Gastro-esophageal reflux disease without esophagitis: Secondary | ICD-10-CM | POA: Diagnosis not present

## 2017-11-16 DIAGNOSIS — I10 Essential (primary) hypertension: Secondary | ICD-10-CM | POA: Diagnosis not present

## 2017-11-16 DIAGNOSIS — Z0000 Encounter for general adult medical examination without abnormal findings: Secondary | ICD-10-CM | POA: Diagnosis not present

## 2017-12-10 DIAGNOSIS — J069 Acute upper respiratory infection, unspecified: Secondary | ICD-10-CM | POA: Diagnosis not present

## 2017-12-10 DIAGNOSIS — R6889 Other general symptoms and signs: Secondary | ICD-10-CM | POA: Diagnosis not present

## 2018-01-01 DIAGNOSIS — K0510 Chronic gingivitis, plaque induced: Secondary | ICD-10-CM | POA: Diagnosis not present

## 2018-01-01 DIAGNOSIS — S025XXB Fracture of tooth (traumatic), initial encounter for open fracture: Secondary | ICD-10-CM | POA: Diagnosis not present

## 2018-01-01 DIAGNOSIS — K0820 Unspecified atrophy of edentulous alveolar ridge: Secondary | ICD-10-CM | POA: Diagnosis not present

## 2018-01-01 DIAGNOSIS — J3489 Other specified disorders of nose and nasal sinuses: Secondary | ICD-10-CM | POA: Diagnosis not present

## 2018-01-01 DIAGNOSIS — K0263 Dental caries on smooth surface penetrating into pulp: Secondary | ICD-10-CM | POA: Diagnosis not present

## 2018-03-01 DIAGNOSIS — E119 Type 2 diabetes mellitus without complications: Secondary | ICD-10-CM | POA: Diagnosis not present

## 2018-03-01 DIAGNOSIS — Z794 Long term (current) use of insulin: Secondary | ICD-10-CM | POA: Diagnosis not present

## 2018-03-01 DIAGNOSIS — K219 Gastro-esophageal reflux disease without esophagitis: Secondary | ICD-10-CM | POA: Diagnosis not present

## 2018-03-01 DIAGNOSIS — I10 Essential (primary) hypertension: Secondary | ICD-10-CM | POA: Diagnosis not present

## 2018-03-14 DIAGNOSIS — Z1231 Encounter for screening mammogram for malignant neoplasm of breast: Secondary | ICD-10-CM | POA: Diagnosis not present

## 2018-03-14 DIAGNOSIS — K219 Gastro-esophageal reflux disease without esophagitis: Secondary | ICD-10-CM | POA: Diagnosis not present

## 2018-03-14 DIAGNOSIS — I10 Essential (primary) hypertension: Secondary | ICD-10-CM | POA: Diagnosis not present

## 2018-03-14 DIAGNOSIS — E119 Type 2 diabetes mellitus without complications: Secondary | ICD-10-CM | POA: Diagnosis not present

## 2018-04-24 ENCOUNTER — Ambulatory Visit (INDEPENDENT_AMBULATORY_CARE_PROVIDER_SITE_OTHER): Payer: 59 | Admitting: Certified Nurse Midwife

## 2018-04-24 ENCOUNTER — Encounter: Payer: Self-pay | Admitting: Certified Nurse Midwife

## 2018-04-24 VITALS — BP 104/64 | HR 95 | Ht 63.0 in | Wt 148.0 lb

## 2018-04-24 DIAGNOSIS — Z1231 Encounter for screening mammogram for malignant neoplasm of breast: Secondary | ICD-10-CM

## 2018-04-24 DIAGNOSIS — Z124 Encounter for screening for malignant neoplasm of cervix: Secondary | ICD-10-CM | POA: Diagnosis not present

## 2018-04-24 DIAGNOSIS — Z01419 Encounter for gynecological examination (general) (routine) without abnormal findings: Secondary | ICD-10-CM

## 2018-04-24 DIAGNOSIS — N6001 Solitary cyst of right breast: Secondary | ICD-10-CM | POA: Diagnosis not present

## 2018-04-24 DIAGNOSIS — I10 Essential (primary) hypertension: Secondary | ICD-10-CM | POA: Insufficient documentation

## 2018-04-24 DIAGNOSIS — E785 Hyperlipidemia, unspecified: Secondary | ICD-10-CM

## 2018-04-24 DIAGNOSIS — Z1239 Encounter for other screening for malignant neoplasm of breast: Secondary | ICD-10-CM

## 2018-04-24 DIAGNOSIS — E1169 Type 2 diabetes mellitus with other specified complication: Secondary | ICD-10-CM | POA: Insufficient documentation

## 2018-04-24 DIAGNOSIS — K219 Gastro-esophageal reflux disease without esophagitis: Secondary | ICD-10-CM | POA: Insufficient documentation

## 2018-04-24 NOTE — Progress Notes (Signed)
Gynecology Annual Exam  PCP: Tracie Harrier, MD  Chief Complaint:  Chief Complaint  Patient presents with  . Gynecologic Exam    History of Present Illness:Tonya Sampson is a 42 year old Caucasian/White female, [place], who presents for her annual exam. She is not having any significant gyn problems.  Her menses are regular and her LMP was 04/17/2018. They occur every 28-30 days , they last 4-5 days , with 1-2 days of heavier flow.  Her flow however has gotten more moderate since her last annual and does not last as long. She has had no spotting.  She reports dysmenorrhea on the second day of her menses. She uses ibuprofen 800 mgm with symptomatic relief.  The patient's past medical history is notable for a history of DM ( has been under better control on Ozempic-her hemoglobin is now 9.2%), GERD,  and hypertension..  Since her last annual GYN exam dated 09/21/16 , she has also been diagnosed with hyperlipidemia and is taking Lipitor. She is sexually active. She is currently using a vasectomy for contraception.  Her most recent pap smear was obtained 09/21/16 and was negative.  Her most recent mammogram obtained on 09/27/16 revealed a mass and additional views and ultrasound showed a 8m probably benign complicated cyst in the right breast (Birads3). Radiologist recommended a bilateral diagnostic mammogram and right ultrasound in a year. There is a positive history of breast cancer in her maternal great aunts x 2. Genetic testing has not been done.  There is no family history of ovarian cancer.  The patient does do self breast exams.  The patient does not smoke.  The patient does drink alcohol once/month.  The patient does not use illegal drugs.  The patient exercises occasionally.  The patient does get adequate calcium in her diet.  She had a recent cholesterol screen in 2019 that was normal except for slightly elevated triglyceride and a low HDL.  Review of Systems: Review  of Systems  Constitutional: Negative for chills, fever and weight loss.  HENT: Negative for congestion, sinus pain and sore throat.   Eyes: Negative for blurred vision and pain.  Respiratory: Negative for hemoptysis, shortness of breath and wheezing.   Cardiovascular: Negative for chest pain, palpitations and leg swelling.  Gastrointestinal: Negative for abdominal pain, blood in stool, diarrhea, heartburn, nausea and vomiting.  Genitourinary: Negative for dysuria, frequency, hematuria and urgency.  Musculoskeletal: Negative for back pain, joint pain and myalgias.  Skin: Negative for itching and rash.  Neurological: Negative for dizziness, tingling and headaches.  Endo/Heme/Allergies: Negative for environmental allergies and polydipsia. Does not bruise/bleed easily.       Negative for hirsutism   Psychiatric/Behavioral: Negative for depression. The patient is not nervous/anxious and does not have insomnia.     Past Medical History:  Past Medical History:  Diagnosis Date  . Allergic rhinitis   . Chickenpox   . Diabetes ([place]   . GERD (gastroesophageal reflux disease)   . Hyperlipidemia associated with type 2 diabetes mellitus ([place]   . Hypertension     Past Surgical History:  Past Surgical History:  Procedure Laterality Date  . FOOT SURGERY  2010   nodule removed from right foot  . LUMBAR DISC SURGERY  2008   discectomy ([facility])    Family History:  Family History  Problem Relation Age of Onset  . Alcoholism Unknown   . Arthritis Unknown   . Uterine cancer Unknown   . Hyperlipidemia Unknown   .  Heart disease Unknown   . Kidney disease Unknown   . Hypertension Unknown   . Diabetes Unknown   . Breast cancer Other   . Diabetes Mother   . Hypertension Mother   . Glaucoma Mother   . Heart disease Father        CHF  . Alcoholism Father   . Renal Disease Father   . Hypertension Sister   . Arthritis/Rheumatoid Sister   . Hypertension Brother   . Asthma Son   .  Hypertension Sister   . Diabetes Sister   . Hypertension Brother   . Arthritis/Rheumatoid Brother   . Hypertension Brother     Social History:  Social History   Socioeconomic History  . Marital status: Married    Spouse name: Not on file  . Number of children: Not on file  . Years of education: Not on file  . Highest education level: Not on file  Occupational History  . Not on file  Social Needs  . Financial resource strain: Not on file  . Food insecurity:    Worry: Not on file    Inability: Not on file  . Transportation needs:    Medical: Not on file    Non-medical: Not on file  Tobacco Use  . Smoking status: Never Smoker  . Smokeless tobacco: Never Used  Substance and Sexual Activity  . Alcohol use: Yes    Alcohol/week: 0.0 oz    Comment: 1x/month  . Drug use: No  . Sexual activity: Yes    Birth control/protection: Other-see comments    Comment: husband vasectomy  Lifestyle  . Physical activity:    Days per week: 3 days    Minutes per session: 60 min  . Stress: Only a little  Relationships  . Social connections:    Talks on phone: Not on file    Gets together: Not on file    Attends religious service: Not on file    Active member of club or organization: Not on file    Attends meetings of clubs or organizations: Not on file    Relationship status: Not on file  . Intimate partner violence:    Fear of current or ex partner: Not on file    Emotionally abused: Not on file    Physically abused: Not on file    Forced sexual activity: Not on file  Other Topics Concern  . Not on file  Social History Narrative  . Not on file    Allergies:  Allergies  Allergen Reactions  . Canagliflozin Other (See Comments)    Yeast infection     Medications:  Current Outpatient Medications on File Prior to Visit  Medication Sig Dispense Refill  . atorvastatin (LIPITOR) 10 MG tablet Take by mouth.    . esomeprazole (NEXIUM) 40 MG capsule Take by mouth.    Marland Kitchen glipiZIDE  (GLUCOTROL) 10 MG tablet Take 1 tablet (10 mg total) by mouth 2 (two) times daily before a meal. 60 tablet 3  . glucose blood (ONE TOUCH ULTRA TEST) test strip     . Insulin Pen Needle (B-D ULTRAFINE III SHORT PEN) 31G X 8 MM MISC USE AS DIRECTED    . lisinopril (PRINIVIL,ZESTRIL) 10 MG tablet Take 1 tablet (10 mg total) by mouth daily. 30 tablet 3  . metFORMIN (GLUCOPHAGE) 1000 MG tablet Take by mouth.    . Omega-3 Fatty Acids (FISH OIL) 1000 MG CAPS Take by mouth.    . ondansetron (ZOFRAN) 4 MG  tablet Take by mouth.    Marland Kitchen OZEMPIC 0.25 or 0.5 MG/DOSE SOPN   5  . Probiotic Product (PROBIOTIC-10 PO) Take by mouth.    . promethazine (PHENERGAN) 25 MG tablet TAKE 1 TABLET BY MOUTH EVERY 6 HOURS AS NEEDED FOR NAUSEA FOR UP TO 7 DAYS.    Marland Kitchen vitamin E 400 UNIT capsule Take 400 Units by mouth daily.     No current facility-administered medications on file prior to visit.     Physical Exam Vitals: BP 104/64   Pulse 95   Ht 5' 3"  (1.6 m)   Wt 148 lb (67.1 kg)   LMP 04/17/2018 (Exact Date)   BMI 26.22 kg/m  General: WF in NAD HEENT: normocephalic, anicteric  Tiny scab on tip of nose with some swelling ?insect bite 16 June Neck: no thyroid enlargement, no palpable nodules, no cervical lymphadenopathy  Pulmonary: No increased work of breathing, CTAB Cardiovascular: RRR, without murmur  Breast: Breast symmetrical, no tenderness, no palpable nodules or masses, no skin or nipple retraction present, no nipple discharge. The tip of her right nipple appears white, but is non tender and not draining.  No axillary, infraclavicular or supraclavicular lymphadenopathy. Abdomen: Soft, non-tender, non-distended.  Umbilicus without lesions.  No hepatomegaly or masses palpable. No evidence of hernia. Genitourinary:  External: Normal external female genitalia.  Normal urethral meatus, normal  Bartholin's and Skene's glands.    Vagina: Normal vaginal mucosa, no evidence of prolapse.    Cervix: Grossly normal in  appearance, no bleeding, non-tender  Uterus: Retroflexed, normal size, shape, and consistency, mobile, and non-tender  Adnexa: No adnexal masses, non-tender  Rectal: deferred  Lymphatic: no evidence of inguinal lymphadenopathy Extremities: no edema, erythema, or tenderness Neurologic: Grossly intact Psychiatric: mood appropriate, affect full     Assessment: 42 y.o. annual gyn exam Birads 3 mammogram  Plan:   1) Breast cancer screening - recommend monthly self breast exam. To RTO if there is any changes to the right nipple (color, masses, drainage) Bilateral diagnostic mammogram and a right breast ultrasound was ordered  2) Cervical cancer screening - Pap was done. ASCCP guidelines and rational discussed.  Patient opts for yearly screening interval  3) Routine healthcare maintenance including  managed by PCP.  Dalia Heading, CNM

## 2018-04-29 DIAGNOSIS — H40053 Ocular hypertension, bilateral: Secondary | ICD-10-CM | POA: Diagnosis not present

## 2018-04-30 LAB — IGP, APTIMA HPV
HPV APTIMA: NEGATIVE
PAP Smear Comment: 0

## 2018-05-06 DIAGNOSIS — H40053 Ocular hypertension, bilateral: Secondary | ICD-10-CM | POA: Diagnosis not present

## 2018-05-10 ENCOUNTER — Ambulatory Visit
Admission: RE | Admit: 2018-05-10 | Discharge: 2018-05-10 | Disposition: A | Discharge status: Home / Self Care | Payer: 59 | Source: Ambulatory Visit | Attending: Certified Nurse Midwife | Admitting: Certified Nurse Midwife

## 2018-05-10 DIAGNOSIS — N6001 Solitary cyst of right breast: Secondary | ICD-10-CM | POA: Diagnosis not present

## 2018-05-10 DIAGNOSIS — Z1231 Encounter for screening mammogram for malignant neoplasm of breast: Secondary | ICD-10-CM | POA: Diagnosis not present

## 2018-05-10 DIAGNOSIS — R928 Other abnormal and inconclusive findings on diagnostic imaging of breast: Secondary | ICD-10-CM | POA: Diagnosis not present

## 2018-05-10 DIAGNOSIS — Z1239 Encounter for other screening for malignant neoplasm of breast: Secondary | ICD-10-CM

## 2018-05-10 DIAGNOSIS — N6313 Unspecified lump in the right breast, lower outer quadrant: Secondary | ICD-10-CM | POA: Diagnosis not present

## 2018-07-11 DIAGNOSIS — I10 Essential (primary) hypertension: Secondary | ICD-10-CM | POA: Diagnosis not present

## 2018-07-11 DIAGNOSIS — K219 Gastro-esophageal reflux disease without esophagitis: Secondary | ICD-10-CM | POA: Diagnosis not present

## 2018-07-11 DIAGNOSIS — Z794 Long term (current) use of insulin: Secondary | ICD-10-CM | POA: Diagnosis not present

## 2018-07-11 DIAGNOSIS — E119 Type 2 diabetes mellitus without complications: Secondary | ICD-10-CM | POA: Diagnosis not present

## 2018-08-13 DIAGNOSIS — K029 Dental caries, unspecified: Secondary | ICD-10-CM | POA: Diagnosis not present

## 2018-08-13 DIAGNOSIS — M25511 Pain in right shoulder: Secondary | ICD-10-CM | POA: Diagnosis not present

## 2018-08-13 DIAGNOSIS — K219 Gastro-esophageal reflux disease without esophagitis: Secondary | ICD-10-CM | POA: Diagnosis not present

## 2018-08-13 DIAGNOSIS — E11638 Type 2 diabetes mellitus with other oral complications: Secondary | ICD-10-CM | POA: Diagnosis not present

## 2018-10-13 ENCOUNTER — Other Ambulatory Visit: Payer: Self-pay

## 2018-10-13 DIAGNOSIS — E1169 Type 2 diabetes mellitus with other specified complication: Secondary | ICD-10-CM | POA: Diagnosis not present

## 2018-10-13 DIAGNOSIS — R112 Nausea with vomiting, unspecified: Secondary | ICD-10-CM | POA: Diagnosis not present

## 2018-10-13 DIAGNOSIS — R000 Tachycardia, unspecified: Secondary | ICD-10-CM | POA: Diagnosis not present

## 2018-10-13 DIAGNOSIS — Z794 Long term (current) use of insulin: Secondary | ICD-10-CM | POA: Diagnosis not present

## 2018-10-13 DIAGNOSIS — Z79899 Other long term (current) drug therapy: Secondary | ICD-10-CM | POA: Diagnosis not present

## 2018-10-13 DIAGNOSIS — I10 Essential (primary) hypertension: Secondary | ICD-10-CM | POA: Insufficient documentation

## 2018-10-13 DIAGNOSIS — R1031 Right lower quadrant pain: Secondary | ICD-10-CM | POA: Insufficient documentation

## 2018-10-13 DIAGNOSIS — E785 Hyperlipidemia, unspecified: Secondary | ICD-10-CM | POA: Diagnosis not present

## 2018-10-13 LAB — COMPREHENSIVE METABOLIC PANEL
ALT: 26 U/L (ref 0–44)
AST: 28 U/L (ref 15–41)
Albumin: 4 g/dL (ref 3.5–5.0)
Alkaline Phosphatase: 59 U/L (ref 38–126)
Anion gap: 12 (ref 5–15)
BUN: 20 mg/dL (ref 6–20)
CHLORIDE: 104 mmol/L (ref 98–111)
CO2: 20 mmol/L — AB (ref 22–32)
Calcium: 8.8 mg/dL — ABNORMAL LOW (ref 8.9–10.3)
Creatinine, Ser: 0.58 mg/dL (ref 0.44–1.00)
GFR calc Af Amer: >60 mL/min (ref >60)
GFR calc non Af Amer: >60 mL/min (ref >60)
Glucose, Bld: 295 mg/dL — ABNORMAL HIGH (ref 70–99)
Potassium: 3.9 mmol/L (ref 3.5–5.1)
Sodium: 136 mmol/L (ref 135–145)
Total Bilirubin: 0.7 mg/dL (ref 0.3–1.2)
Total Protein: 7.9 g/dL (ref 6.5–8.1)

## 2018-10-13 LAB — URINALYSIS, COMPLETE (UACMP) WITH MICROSCOPIC
Bacteria, UA: NONE SEEN
Bilirubin Urine: NEGATIVE
Glucose, UA: >=500 mg/dL — AB
KETONES UR: 20 mg/dL — AB
Leukocytes, UA: NEGATIVE
Nitrite: NEGATIVE
Protein, ur: 100 mg/dL — AB
Specific Gravity, Urine: 1.028 (ref 1.005–1.030)
pH: 5 (ref 5.0–8.0)

## 2018-10-13 LAB — CBC
HCT: 39.5 % (ref 36.0–46.0)
Hemoglobin: 13 g/dL (ref 12.0–15.0)
MCH: 27.1 pg (ref 26.0–34.0)
MCHC: 32.9 g/dL (ref 30.0–36.0)
MCV: 82.5 fL (ref 80.0–100.0)
Platelets: 358 10*3/uL (ref 150–400)
RBC: 4.79 MIL/uL (ref 3.87–5.11)
RDW: 13.1 % (ref 11.5–15.5)
WBC: 14.6 10*3/uL — ABNORMAL HIGH (ref 4.0–10.5)
nRBC: 0 % (ref 0.0–0.2)

## 2018-10-13 LAB — LIPASE, BLOOD: Lipase: 38 U/L (ref 11–51)

## 2018-10-13 MED ORDER — ONDANSETRON 4 MG PO TBDP
4.0000 mg | ORAL_TABLET | Freq: Once | ORAL | Status: AC | PRN
  Administered 2018-10-13: 4 mg via ORAL
  Filled 2018-10-13: qty 1

## 2018-10-13 NOTE — ED Triage Notes (Signed)
Reports abdominal pain and vomiting since 12:30 pm.

## 2018-10-14 ENCOUNTER — Emergency Department: Payer: 59

## 2018-10-14 ENCOUNTER — Emergency Department
Admission: EM | Admit: 2018-10-14 | Discharge: 2018-10-14 | Disposition: A | Discharge status: Home / Self Care | Payer: 59 | Attending: Emergency Medicine | Admitting: Emergency Medicine

## 2018-10-14 ENCOUNTER — Encounter: Payer: Self-pay | Admitting: Radiology

## 2018-10-14 DIAGNOSIS — Z79899 Other long term (current) drug therapy: Secondary | ICD-10-CM | POA: Diagnosis not present

## 2018-10-14 DIAGNOSIS — I10 Essential (primary) hypertension: Secondary | ICD-10-CM | POA: Diagnosis not present

## 2018-10-14 DIAGNOSIS — R1031 Right lower quadrant pain: Secondary | ICD-10-CM | POA: Diagnosis not present

## 2018-10-14 DIAGNOSIS — R112 Nausea with vomiting, unspecified: Secondary | ICD-10-CM | POA: Diagnosis not present

## 2018-10-14 DIAGNOSIS — K760 Fatty (change of) liver, not elsewhere classified: Secondary | ICD-10-CM | POA: Diagnosis not present

## 2018-10-14 DIAGNOSIS — E1169 Type 2 diabetes mellitus with other specified complication: Secondary | ICD-10-CM | POA: Diagnosis not present

## 2018-10-14 DIAGNOSIS — E785 Hyperlipidemia, unspecified: Secondary | ICD-10-CM | POA: Diagnosis not present

## 2018-10-14 DIAGNOSIS — Z794 Long term (current) use of insulin: Secondary | ICD-10-CM | POA: Diagnosis not present

## 2018-10-14 MED ORDER — IOPAMIDOL (ISOVUE-300) INJECTION 61%
100.0000 mL | Freq: Once | INTRAVENOUS | Status: AC | PRN
  Administered 2018-10-14: 100 mL via INTRAVENOUS

## 2018-10-14 MED ORDER — ONDANSETRON HCL 4 MG/2ML IJ SOLN
4.0000 mg | Freq: Once | INTRAMUSCULAR | Status: AC
  Administered 2018-10-14: 4 mg via INTRAVENOUS
  Filled 2018-10-14: qty 2

## 2018-10-14 MED ORDER — IOPAMIDOL (ISOVUE-M 300) INJECTION 61%: 15.0000 mL | Freq: Once | INTRAMUSCULAR | Status: DC | PRN

## 2018-10-14 MED ORDER — SODIUM CHLORIDE 0.9 % IV BOLUS
1000.0000 mL | Freq: Once | INTRAVENOUS | Status: AC
  Administered 2018-10-14: 1000 mL via INTRAVENOUS

## 2018-10-14 MED ORDER — ONDANSETRON 4 MG PO TBDP: 4.0000 mg | ORAL_TABLET | Freq: Three times a day (TID) | ORAL | 0 refills | Status: DC | PRN

## 2018-10-14 NOTE — ED Notes (Signed)
Patient transported to CT 

## 2018-10-14 NOTE — ED Notes (Signed)
Patient c/o N/V beginning this am. Patient reports multiple emeses. Patient denies diarrhea. Patient c/o mid/upper abdominal pain described as burning.   Patient reports she has been constipated, last normal BM 1 week ago. Patient reports she took a laxative yesterday and had a small BM, but not large enough to account for her constipation.   Patient reports she and her husband just returned from a cruise. Patient reports he had multiple episodes of diarrhea after returning from the cruise.

## 2018-10-14 NOTE — Discharge Instructions (Signed)
It was a pleasure to take care of you today, and thank you for coming to our emergency department.  If you have any questions or concerns before leaving please ask the nurse to grab me and I'm more than happy to go through your aftercare instructions again.  If you were prescribed any opioid pain medication today such as Norco, Vicodin, Percocet, morphine, hydrocodone, or oxycodone please make sure you do not drive when you are taking this medication as it can alter your ability to drive safely.  If you have any concerns once you are home that you are not improving or are in fact getting worse before you can make it to your follow-up appointment, please do not hesitate to call 911 and come back for further evaluation.  Darel Hong, MD  Results for orders placed or performed during the hospital encounter of 10/14/18  Lipase, blood  Result Value Ref Range   Lipase 38 11 - 51 U/L  Comprehensive metabolic panel  Result Value Ref Range   Sodium 136 135 - 145 mmol/L   Potassium 3.9 3.5 - 5.1 mmol/L   Chloride 104 98 - 111 mmol/L   CO2 20 (L) 22 - 32 mmol/L   Glucose, Bld 295 (H) 70 - 99 mg/dL   BUN 20 6 - 20 mg/dL   Creatinine, Ser 0.58 0.44 - 1.00 mg/dL   Calcium 8.8 (L) 8.9 - 10.3 mg/dL   Total Protein 7.9 6.5 - 8.1 g/dL   Albumin 4.0 3.5 - 5.0 g/dL   AST 28 15 - 41 U/L   ALT 26 0 - 44 U/L   Alkaline Phosphatase 59 38 - 126 U/L   Total Bilirubin 0.7 0.3 - 1.2 mg/dL   GFR calc non Af Amer >60 >60 mL/min   GFR calc Af Amer >60 >60 mL/min   Anion gap 12 5 - 15  CBC  Result Value Ref Range   WBC 14.6 (H) 4.0 - 10.5 K/uL   RBC 4.79 3.87 - 5.11 MIL/uL   Hemoglobin 13.0 12.0 - 15.0 g/dL   HCT 39.5 36.0 - 46.0 %   MCV 82.5 80.0 - 100.0 fL   MCH 27.1 26.0 - 34.0 pg   MCHC 32.9 30.0 - 36.0 g/dL   RDW 13.1 11.5 - 15.5 %   Platelets 358 150 - 400 K/uL   nRBC 0.0 0.0 - 0.2 %  Urinalysis, Complete w Microscopic  Result Value Ref Range   Color, Urine YELLOW (A) YELLOW   APPearance CLEAR  (A) CLEAR   Specific Gravity, Urine 1.028 1.005 - 1.030   pH 5.0 5.0 - 8.0   Glucose, UA >=500 (A) NEGATIVE mg/dL   Hgb urine dipstick LARGE (A) NEGATIVE   Bilirubin Urine NEGATIVE NEGATIVE   Ketones, ur 20 (A) NEGATIVE mg/dL   Protein, ur 100 (A) NEGATIVE mg/dL   Nitrite NEGATIVE NEGATIVE   Leukocytes, UA NEGATIVE NEGATIVE   RBC / HPF 0-5 0 - 5 RBC/hpf   WBC, UA 0-5 0 - 5 WBC/hpf   Bacteria, UA NONE SEEN NONE SEEN   Squamous Epithelial / LPF 0-5 0 - 5   Mucus PRESENT    Ct Abdomen Pelvis W Contrast  Result Date: 10/14/2018 CLINICAL DATA:  Initial evaluation for acute abdominal pain, appendicitis suspected. EXAM: CT ABDOMEN AND PELVIS WITH CONTRAST TECHNIQUE: Multidetector CT imaging of the abdomen and pelvis was performed using the standard protocol following bolus administration of intravenous contrast. CONTRAST:  188m ISOVUE-300 IOPAMIDOL (ISOVUE-300) INJECTION 61% COMPARISON:  Prior CT from 03/24/2012. FINDINGS: Lower chest: Visualized lung bases are clear. Hepatobiliary: Diffuse hypoattenuation of the liver compatible steatosis. Liver otherwise unremarkable. Gallbladder within normal limits. No biliary dilatation. Pancreas: Pancreas within normal limits. Spleen: Spleen within normal limits. Adrenals/Urinary Tract: Adrenal glands are normal. Kidneys equal in size with symmetric enhancement. No nephrolithiasis, hydronephrosis, or focal enhancing renal mass. No hydroureter. Partially distended bladder within normal limits. Stomach/Bowel: Stomach within normal limits. No evidence for bowel obstruction. Appendix well visualized within the right lower quadrant and is of normal caliber and appearance without associated inflammatory changes to suggest acute appendicitis. No acute inflammatory changes seen elsewhere about the bowels. Vascular/Lymphatic: Moderate aorto bi-iliac atherosclerotic disease. No aneurysm. Mesenteric vessels patent proximally. No adenopathy. Reproductive: Uterus within normal  limits. Left ovary unremarkable. 3.1 cm right adnexal cyst, possibly reflecting a small hemorrhagic cyst. Other: No free air or fluid. Musculoskeletal: No acute osseous abnormality. Somewhat ill-defined sclerosis noted within the posterior aspect of the L4 vertebral body without discrete lesion. No other discrete or worrisome osseous lesions. Degenerative disc bulging noted at L4-5 and L5-S1. IMPRESSION: 1. No CT evidence for acute intra-abdominal or pelvic process. Normal appendix. 2. 3.1 cm right adnexal cyst, possibly reflecting a small hemorrhagic cyst. 3. Hepatic steatosis. 4. Moderate aorto bi-iliac atherosclerotic disease. No aneurysm. Electronically Signed   By: Jeannine Boga M.D.   On: 10/14/2018 02:14

## 2018-10-14 NOTE — ED Notes (Signed)
Reviewed discharge instructions, follow-up care, and prescriptions with patient. Patient verbalized understanding of all information reviewed. Patient stable, with no distress noted at this time.    

## 2018-10-14 NOTE — ED Provider Notes (Signed)
[facility] Emergency Department Provider Note  ____________________________________________   First MD Initiated Contact with Patient 10/14/18 [phone]     (approximate)  I have reviewed the triage vital signs and the nursing notes.   HISTORY  Chief Complaint Abdominal Pain and Emesis   HPI Tonya Sampson is a 42 y.o. female who comes to the emergency department with nausea and vomiting that began suddenly this morning.  She does have cramping diffuse moderate severity abdominal pain worse when vomiting and improved when not.  She is also been constipated with the last bowel movement about 1 week ago.  She is passing flatus.  She came back from a cruise yesterday from the [place].  On the cruise her husband had profound diarrhea but the patient had none.  She has no history of abdominal surgeries.  Her symptoms came on suddenly were severe and nothing seems to make them better or worse.    Past Medical History:  Diagnosis Date  . Allergic rhinitis   . Chickenpox   . Diabetes ([place])   . GERD (gastroesophageal reflux disease)   . Hyperlipidemia associated with type 2 diabetes mellitus ([place])   . Hypertension     Patient Active Problem List   Diagnosis Date Noted  . Hyperlipidemia associated with type 2 diabetes mellitus ([place])   . GERD (gastroesophageal reflux disease)   . Hypertension   . Viral upper respiratory infection 12/20/2015  . Diabetes ([place]) 12/20/2015  . Benign nevus 12/20/2015  . Abnormal mammogram 12/20/2015    Past Surgical History:  Procedure Laterality Date  . FOOT SURGERY  2010   nodule removed from right foot  . LUMBAR [place] SURGERY  2008   discectomy ([facility])    Prior to Admission medications   Medication Sig Start Date End Date Taking? Authorizing Provider  atorvastatin (LIPITOR) 10 MG tablet Take by mouth. 11/16/17 11/16/18  [provider]  esomeprazole (NEXIUM) 40 MG capsule Take by mouth. 03/14/18 03/14/19  [provider]  glipiZIDE (GLUCOTROL) 10 MG tablet Take 1 tablet (10 mg total) by mouth 2 (two) times daily before a meal. 12/20/15   Leone Haven, MD  glucose blood (ONE TOUCH ULTRA TEST) test strip  12/04/14   [provider]  Insulin Pen Needle (B-D ULTRAFINE III SHORT PEN) 31G X 8 MM MISC USE AS DIRECTED 07/17/17   [provider]  lisinopril (PRINIVIL,ZESTRIL) 10 MG tablet Take 1 tablet (10 mg total) by mouth daily. 12/20/15   Leone Haven, MD  metFORMIN (GLUCOPHAGE) 1000 MG tablet Take by mouth. 11/16/17   [provider]  Omega-3 Fatty Acids (FISH OIL) 1000 MG CAPS Take by mouth.    [provider]  ondansetron (ZOFRAN ODT) 4 MG disintegrating tablet Take 1 tablet (4 mg total) by mouth every 8 (eight) hours as needed for nausea or vomiting. 10/14/18   Darel Hong, MD  ondansetron (ZOFRAN) 4 MG tablet Take by mouth. 07/17/17   [provider]  OZEMPIC 0.25 or 0.5 MG/DOSE [facility]  04/15/18   [provider]  Probiotic Product (PROBIOTIC-10 PO) Take by mouth.    [provider]  promethazine (PHENERGAN) 25 MG tablet TAKE 1 TABLET BY MOUTH EVERY 6 HOURS AS NEEDED FOR NAUSEA FOR UP TO 7 DAYS. 01/08/18   [provider]  vitamin E 400 UNIT capsule Take 400 Units by mouth daily.    [provider]    Allergies Canagliflozin  Family History  Problem Relation  Age of Onset  . Alcoholism Unknown   . Arthritis Unknown   . Uterine cancer Unknown   . Hyperlipidemia Unknown   . Heart disease Unknown   . Kidney disease Unknown   . Hypertension Unknown   . Diabetes Unknown   . Breast cancer Other   . Diabetes Mother   . Hypertension Mother   . Glaucoma Mother   . Heart disease Father        CHF  . Alcoholism Father   . Renal Disease Father   . Hypertension Sister   . Arthritis/Rheumatoid Sister   . Hypertension Brother   . Asthma Son   . Hypertension Sister   . Diabetes Sister   . Hypertension Brother    . Arthritis/Rheumatoid Brother   . Hypertension Brother     Social History Social History   Tobacco Use  . Smoking status: Never Smoker  . Smokeless tobacco: Never Used  Substance Use Topics  . Alcohol use: Yes    Alcohol/week: 0.0 standard drinks    Comment: 1x/month  . Drug use: No    Review of Systems Constitutional: No fever/chills Eyes: No visual changes. ENT: No sore throat. Cardiovascular: Denies chest pain. Respiratory: Denies shortness of breath. Gastrointestinal: Positive for abdominal pain.  Positive for nausea, positive for vomiting.  No diarrhea.  Positive for constipation. Genitourinary: Negative for dysuria. Musculoskeletal: Negative for back pain. Skin: Negative for rash. Neurological: Negative for headaches, focal weakness or numbness.   ____________________________________________   PHYSICAL EXAM:  VITAL SIGNS: ED Triage Vitals [10/13/18 2128]  Enc Vitals Group     BP (!) 155/73     Pulse Rate (!) 127     Resp 20     Temp 98.4 F (36.9 C)     Temp Source Oral     SpO2 98 %     Weight 145 lb (65.8 kg)     Height 5' 3"  (1.6 m)     Head Circumference      Peak Flow      Pain Score 6     Pain Loc      Pain Edu?      Excl. in [place]?     Constitutional: Alert and oriented x4 appears obviously somewhat uncomfortable although nontoxic no diaphoresis Eyes: PERRL EOMI. Head: Atraumatic. Nose: No congestion/rhinnorhea. Mouth/Throat: No trismus Neck: No stridor.   Cardiovascular: Tachycardic rate, regular rhythm. Grossly normal heart sounds.  Good peripheral circulation. Respiratory: Normal respiratory effort.  No retractions. Lungs CTAB and moving good air Gastrointestinal: Soft somewhat tender in the right lower quadrant although a bit lower than McBurney's point with no rebound or guarding no peritonitis.  Negative Murphy's no costovertebral tenderness Musculoskeletal: No lower extremity edema   Neurologic:  Normal speech and language. No gross  focal neurologic deficits are appreciated. Skin:  Skin is warm, dry and intact. No rash noted. Psychiatric: Mood and affect are normal. Speech and behavior are normal.    ____________________________________________   DIFFERENTIAL includes but not limited to  Appendicitis, gastroenteritis, nephrolithiasis, pyelonephritis ____________________________________________   LABS (all labs ordered are listed, but only abnormal results are displayed)  Labs Reviewed  COMPREHENSIVE METABOLIC PANEL - Abnormal; Notable for the following components:      Result Value   CO2 20 (*)    Glucose, Bld 295 (*)    Calcium 8.8 (*)    All other components within normal limits  CBC - Abnormal; Notable for the following components:   WBC 14.6 (*)  All other components within normal limits  URINALYSIS, COMPLETE (UACMP) WITH MICROSCOPIC - Abnormal; Notable for the following components:   Color, Urine YELLOW (*)    APPearance CLEAR (*)    Glucose, UA >=500 (*)    Hgb urine dipstick LARGE (*)    Ketones, ur 20 (*)    Protein, ur 100 (*)    All other components within normal limits  LIPASE, BLOOD    Lab work reviewed by me with elevated white count concerning for infection.  Low CO2 concerning for metabolic acidosis __________________________________________  EKG   ____________________________________________  RADIOLOGY  CT abdomen pelvis reviewed by me with no clear etiology of the patient's symptoms identified ____________________________________________   PROCEDURES  Procedure(s) performed: no  Procedures  Critical Care performed: no  ____________________________________________   INITIAL IMPRESSION / ASSESSMENT AND PLAN / ED COURSE  Pertinent labs & imaging results that were available during my care of the patient were reviewed by me and considered in my medical decision making (see chart for details).   As part of my medical decision making, I reviewed the following data  within the [place] History obtained from family if available, nursing notes, old chart and ekg, as well as notes from prior ED visits.  The patient presents to the emergency department with abdominal pain nausea and vomiting.  She is somewhat tender in her right lower quadrant and tachycardic so given 8 mg of IV Zofran along with a liter of IV fluids and will get a CT scan of her abdomen pelvis to evaluate for appendicitis.  She declines pain medication at this time.  Following IV Zofran and fluids her symptoms have improved and her tachycardia has improved.  Her CT scan is fortunately reassuring.  I had a lengthy discussion with the patient and her husband regarding the diagnostic uncertainty and the possibility that this could still represent early appendicitis that is not yet shown up on CT scan.  I will prescribe her Zofran for home and strict return precautions have been given.  The patient is a Equities trader and is fully aware of the possibility of an intra-abdominal infection and when to return.      ____________________________________________   FINAL CLINICAL IMPRESSION(S) / ED DIAGNOSES  Final diagnoses:  Nausea and vomiting, intractability of vomiting not specified, unspecified vomiting type      NEW MEDICATIONS STARTED DURING THIS VISIT:  Discharge Medication List as of 10/14/2018  4:30 AM    START taking these medications   Details  ondansetron (ZOFRAN ODT) 4 MG disintegrating tablet Take 1 tablet (4 mg total) by mouth every 8 (eight) hours as needed for nausea or vomiting., Starting Mon 10/14/2018, Print         Note:  This document was prepared using Dragon voice recognition software and may include unintentional dictation errors.     Darel Hong, MD 10/15/18 [phone]

## 2018-11-04 IMAGING — US US BREAST*R* LIMITED INC AXILLA
1 series · 11 of 11 positions shown · non-contrast
Comparison: Previous exam(s).

CLINICAL DATA: Delayed follow-up for a probably benign right breast
mass.

EXAM:
2D DIGITAL DIAGNOSTIC BILATERAL MAMMOGRAM WITH CAD AND ADJUNCT TOMO
ULTRASOUND RIGHT BREAST

[Series 1: us breast*right* limited inc axilla · 0.05mm/px · 11 of 11 slices shown]
[im 1/11]
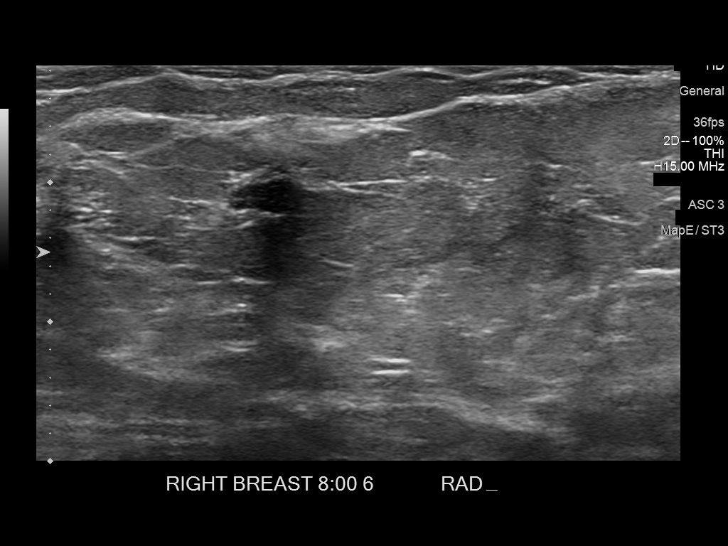
[im 2/11]
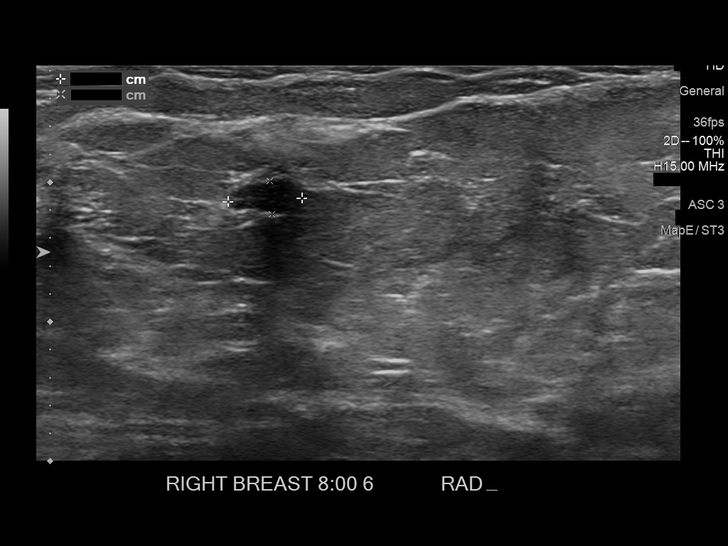
[im 3/11]
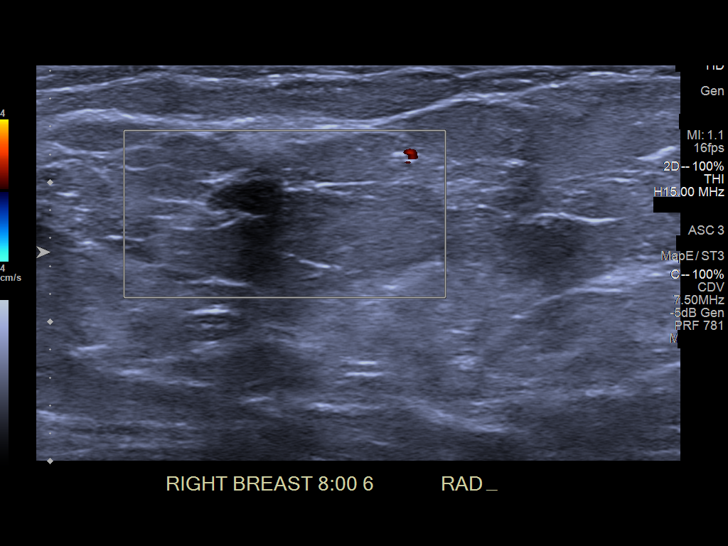
[im 4/11]
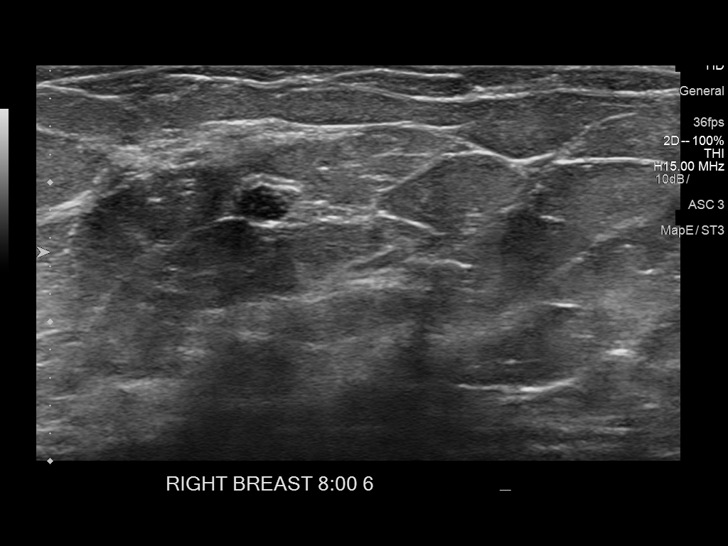
[im 5/11]
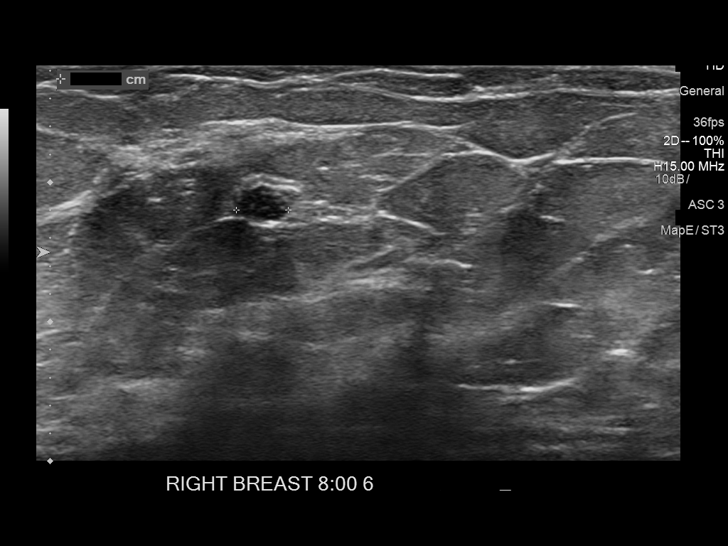
[im 6/11]
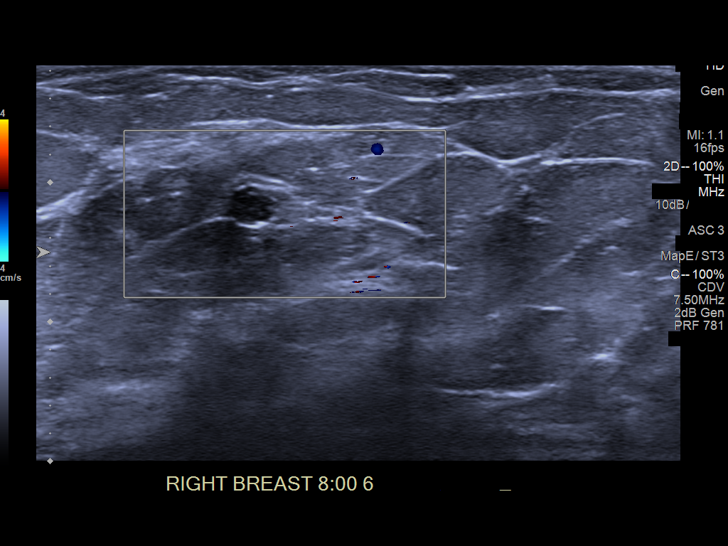
[im 7/11]
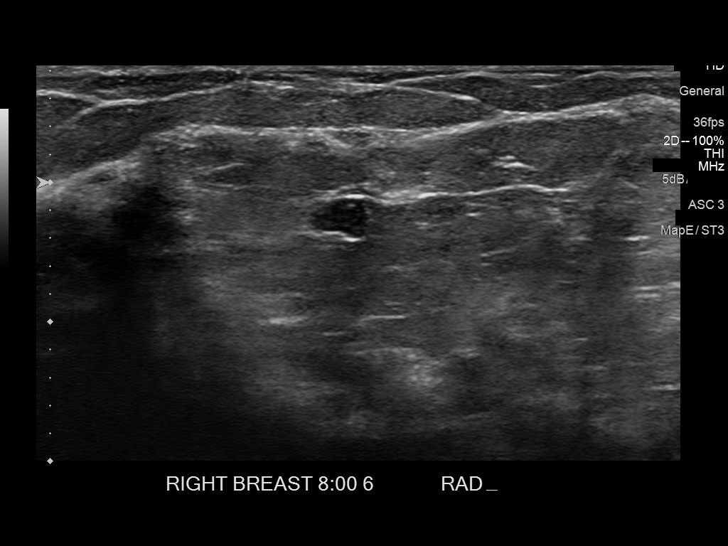
[im 8/11]
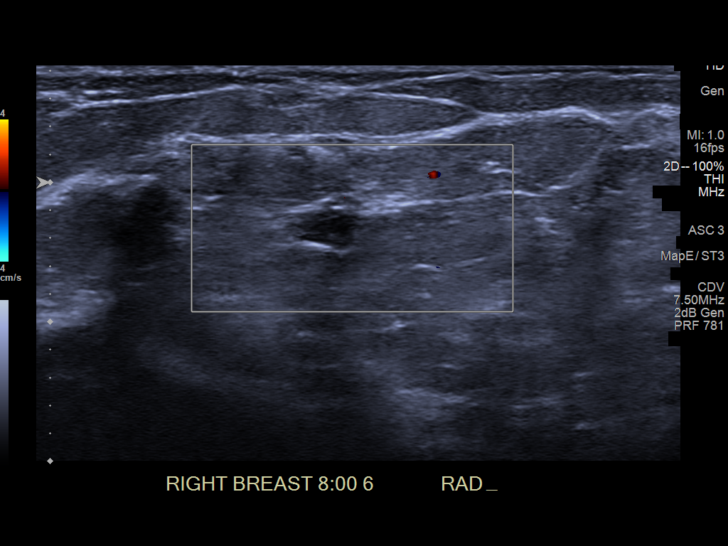
[im 9/11]
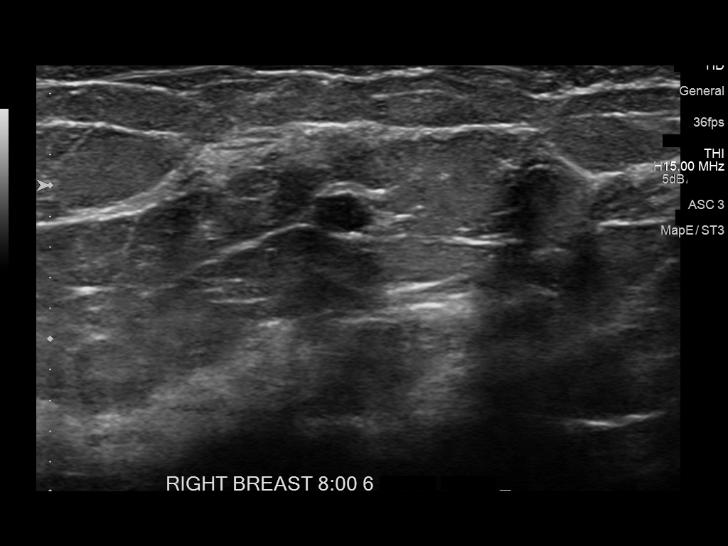
[im 10/11]
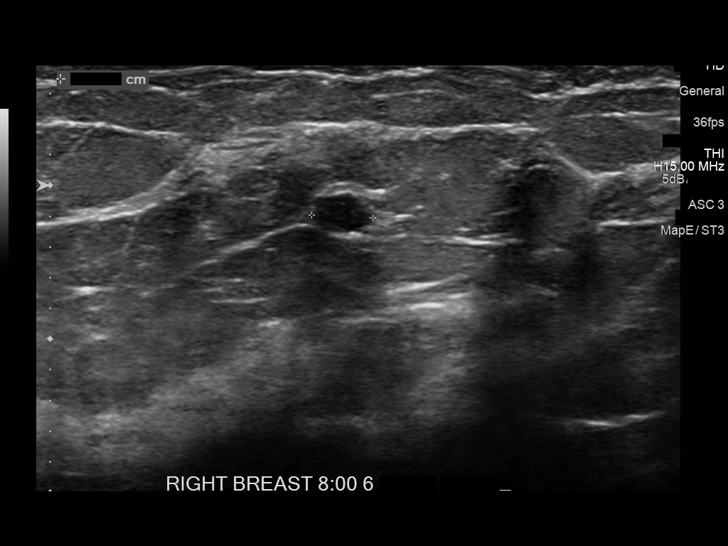
[im 11/11]
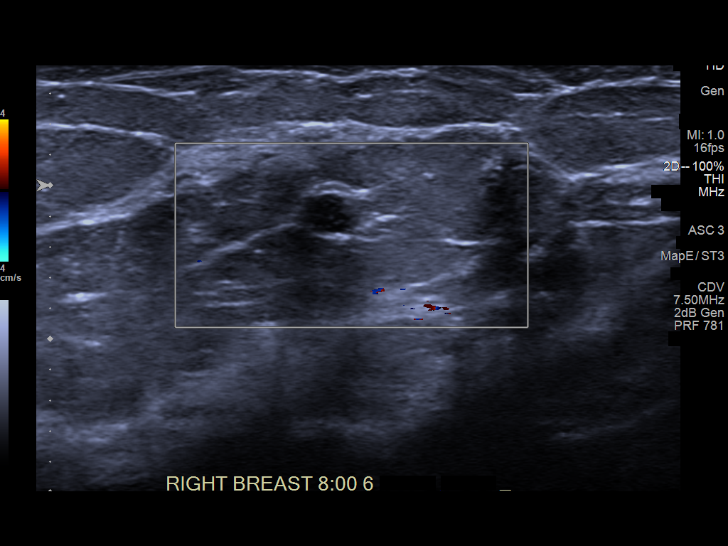

[11 of 11 positions shown; findings below may reference images not displayed]

ACR Breast Density Category c: The breast tissue is heterogeneously
dense, which may obscure small masses.
FINDINGS: The previously noted 5 mm oval, circumscribed, low-density mass
within the lower, outer right breast is not significantly changed
from the 09/21/2015 exam. No new or suspicious abnormality is
identified within either breast.

Mammographic images were processed with CAD.

Targeted ultrasound is performed demonstrating an oval,
circumscribed, hypoechoic mass at 8 o'clock, 6 cm from the nipple
measuring 5 x 2 x 4 mm, similar to slightly smaller in size given
differences in measurement technique. This mass is again thought to
likely represent a small complicated cyst.
IMPRESSION: Probably benign right breast finding.

RECOMMENDATION:
Bilateral diagnostic mammogram and possible right breast ultrasound
in 1 year.

I have discussed the findings and recommendations with the patient.
Results were also provided in writing at the conclusion of the
visit. If applicable, a reminder letter will be sent to the patient
regarding the next appointment.

BI-RADS CATEGORY  3: Probably benign.

## 2018-11-07 DIAGNOSIS — E113293 Type 2 diabetes mellitus with mild nonproliferative diabetic retinopathy without macular edema, bilateral: Secondary | ICD-10-CM | POA: Diagnosis not present

## 2018-12-16 DIAGNOSIS — E11638 Type 2 diabetes mellitus with other oral complications: Secondary | ICD-10-CM | POA: Diagnosis not present

## 2018-12-16 DIAGNOSIS — R7989 Other specified abnormal findings of blood chemistry: Secondary | ICD-10-CM | POA: Diagnosis not present

## 2018-12-16 DIAGNOSIS — K219 Gastro-esophageal reflux disease without esophagitis: Secondary | ICD-10-CM | POA: Diagnosis not present

## 2018-12-16 DIAGNOSIS — K029 Dental caries, unspecified: Secondary | ICD-10-CM | POA: Diagnosis not present

## 2018-12-19 DIAGNOSIS — E785 Hyperlipidemia, unspecified: Secondary | ICD-10-CM | POA: Diagnosis not present

## 2018-12-19 DIAGNOSIS — E1165 Type 2 diabetes mellitus with hyperglycemia: Secondary | ICD-10-CM | POA: Diagnosis not present

## 2018-12-19 DIAGNOSIS — K219 Gastro-esophageal reflux disease without esophagitis: Secondary | ICD-10-CM | POA: Diagnosis not present

## 2018-12-19 DIAGNOSIS — Z0000 Encounter for general adult medical examination without abnormal findings: Secondary | ICD-10-CM | POA: Diagnosis not present

## 2019-02-12 MED FILL — ALPRAZolam 0.25 MG TABS: 0.25 | 30 days supply | Qty: 30 | Fill #0 | Status: TO

## 2019-02-12 MED FILL — OZEMPIC 0.25 OR 0.5 MG/DOSE: 2 | 90 days supply | Qty: 5 | Fill #0

## 2019-02-18 MED FILL — ESOMEPRAZOLE MAG DR 40 MG C: 40 | 90 days supply | Qty: 180 | Fill #0

## 2019-02-19 MED FILL — PHENTERMINE 37.5 MG TABLET: 37.5 | 30 days supply | Qty: 30 | Fill #0

## 2019-06-25 NOTE — Progress Notes (Signed)
Gynecology Annual Exam  PCP: Tracie Harrier, MD  Chief Complaint:  Chief Complaint  Patient presents with  . Gynecologic Exam    History of Present Illness:Tonya Sampson is a 43 year old Caucasian/White female, [place], who presents for her annual exam. She has concerns regarding her heavy menstrual flow and is inquiring about getting a progesterone IUD to help lessen her flow. Her menses are usually regular and her LMP was 06/12/2019. They occur every 28 days , they last 7-8 days , with 3-4 days of heavier flow with clots requiring tampon and pad changes every 2-3 hours. Her menses has been heavier for "years" but her menses have gotten a little longer and heavier over the last year. She has had no intermenstrual bleeding, but has had 2 menses that came earlier than usual this past year. She reports dysmenorrhea on the heavy days of her menses. She also has [place]z some months that lasts about an hour. She uses ibuprofen  with symptomatic relief.  The patient's past medical history is notable for a history of DM (last hemoglobin is 9.3%), hyperlipidemia, GERD,  and hypertension..  Since her last annual GYN exam dated 04/24/18, she has had no significant changes in her health.  She is sexually active. She is currently using a vasectomy for contraception.  Her most recent pap smear was obtained 04/24/18 and was NIL/negative HRHPV.  Her most recent mammogram obtained on 05/10/2018 was benign. There is a positive history of breast cancer in her maternal great aunts x 2. Genetic testing has not been done.  There is no family history of ovarian cancer.  The patient does do self breast exams.  The patient does not smoke.  The patient does drink alcohol once/week  The patient does not use illegal drugs.  The patient exercises three times a week The patient does get adequate calcium in her diet.  She had a recent cholesterol screen in 2020 that was normal on her Lipitor.  Review  of Systems: Review of Systems  Constitutional: Negative for chills, fever and weight loss.  HENT: Negative for congestion, sinus pain and sore throat.   Eyes: Negative for blurred vision and pain.  Respiratory: Negative for hemoptysis, shortness of breath and wheezing.   Cardiovascular: Negative for chest pain, palpitations and leg swelling.  Gastrointestinal: Negative for abdominal pain, blood in stool, diarrhea, heartburn, nausea and vomiting.  Genitourinary: Negative for dysuria, frequency, hematuria and urgency.       Positive for heavy menses  Musculoskeletal: Negative for back pain, joint pain and myalgias.  Skin: Negative for itching and rash.  Neurological: Negative for dizziness, tingling and headaches.  Endo/Heme/Allergies: Negative for environmental allergies and polydipsia. Does not bruise/bleed easily.       Negative for hirsutism   Psychiatric/Behavioral: Negative for depression. The patient is not nervous/anxious and does not have insomnia.     Past Medical History:  Past Medical History:  Diagnosis Date  . Allergic rhinitis   . Chickenpox   . Diabetes ([place])   . GERD (gastroesophageal reflux disease)   . Hyperlipidemia associated with type 2 diabetes mellitus ([place])   . Hypertension     Past Surgical History:  Past Surgical History:  Procedure Laterality Date  . FOOT SURGERY  2010   nodule removed from right foot  . LUMBAR DISC SURGERY  2008   discectomy ([facility])    Family History:  Family History  Problem Relation Age of Onset  . Alcoholism  Other   . Arthritis Other   . Uterine cancer Other   . Hyperlipidemia Other   . Heart disease Other   . Kidney disease Other   . Hypertension Other   . Diabetes Other   . Breast cancer Other   . Diabetes Mother   . Hypertension Mother   . Glaucoma Mother   . Heart disease Father        CHF  . Alcoholism Father   . Renal Disease Father   . Hypertension Sister   . Arthritis/Rheumatoid Sister   . Hypertension  Brother   . Asthma Son   . Hypertension Sister   . Diabetes Sister   . Hypertension Brother   . Arthritis/Rheumatoid Brother   . Hypertension Brother     Social History:  Social History   Socioeconomic History  . Marital status: Married    Spouse name: Not on file  . Number of children: 2  . Years of education: Not on file  . Highest education level: Not on file  Occupational History  . Occupation: same day surgery  Social Needs  . Financial resource strain: Not on file  . Food insecurity    Worry: Not on file    Inability: Not on file  . Transportation needs    Medical: Not on file    Non-medical: Not on file  Tobacco Use  . Smoking status: Never Smoker  . Smokeless tobacco: Never Used  Substance and Sexual Activity  . Alcohol use: Yes    Alcohol/week: 1.0 standard drinks    Types: 1 Standard drinks or equivalent per week  . Drug use: No  . Sexual activity: Yes    Birth control/protection: Other-see comments    Comment: husband vasectomy  Lifestyle  . Physical activity    Days per week: 3 days    Minutes per session: 60 min  . Stress: Only a little  Relationships  . Social Herbalist on phone: Not on file    Gets together: Not on file    Attends religious service: Not on file    Active member of club or organization: Not on file    Attends meetings of clubs or organizations: Not on file    Relationship status: Not on file  . Intimate partner violence    Fear of current or ex partner: Not on file    Emotionally abused: Not on file    Physically abused: Not on file    Forced sexual activity: Not on file  Other Topics Concern  . Not on file  Social History Narrative  . Not on file    Allergies:  Allergies  Allergen Reactions  . Canagliflozin Other (See Comments)    Yeast infection     Medications:  Current Outpatient Medications on File Prior to Visit  Medication Sig Dispense Refill  . Blood Glucose Monitoring Suppl (FIFTY50 GLUCOSE  METER 2.0) w/Device KIT Apply 1 Device topically 2 (two) times daily.    Marland Kitchen glipiZIDE (GLUCOTROL) 10 MG tablet Take 1 tablet (10 mg total) by mouth 2 (two) times daily before a meal. 60 tablet 3  . glucose blood (ONE TOUCH ULTRA TEST) test strip     . Insulin Pen Needle (B-D ULTRAFINE III SHORT PEN) 31G X 8 MM MISC USE AS DIRECTED    . losartan (COZAAR) 25 MG tablet Take 1 tablet by mouth daily.    . metFORMIN (GLUCOPHAGE) 1000 MG tablet Take by mouth.    Marland Kitchen  ondansetron (ZOFRAN) 4 MG tablet Take by mouth.    Marland Kitchen OZEMPIC 0.25 or 0.5 MG/DOSE SOPN   5  . atorvastatin (LIPITOR) 10 MG tablet Take by mouth.    . esomeprazole (NEXIUM) 40 MG capsule Take by mouth.     No current facility-administered medications on file prior to visit.     Physical Exam Vitals: BP 140/80   Pulse 89   Ht 5' 3"  (1.6 m)   Wt 151 lb (68.5 kg)   LMP 06/12/2019 (Exact Date)   BMI 26.75 kg/m  General: WF in NAD HEENT: normocephalic, anicteric Neck: no thyroid enlargement, no palpable nodules, no cervical lymphadenopathy  Pulmonary: No increased work of breathing, CTAB Cardiovascular: RRR, without murmur  Breast: Breast symmetrical, no tenderness, no palpable nodules or masses, no skin or nipple retraction present, no nipple discharge. The tip of her right nipple appears white, but is non tender and not draining (no change).  No axillary, infraclavicular or supraclavicular lymphadenopathy. Abdomen: Soft, non-tender, non-distended.  Umbilicus without lesions.  No hepatomegaly or masses palpable. No evidence of hernia. Genitourinary:  External: Normal external female genitalia.  Normal urethral meatus, normal Bartholin's and Skene's glands.    Vagina: Normal vaginal mucosa, no evidence of prolapse.    Cervix: Grossly normal in appearance, no bleeding, non-tender  Uterus: Retroflexed, normal size, shape, and consistency, mobile, and non-tender  Adnexa: No adnexal masses, non-tender  Rectal: deferred  Lymphatic: no  evidence of inguinal lymphadenopathy Extremities: no edema, erythema, or tenderness Neurologic: Grossly intact Psychiatric: mood appropriate, affect full     Assessment: 43 y.o. annual gyn exam Menorrhagia  Plan:   1) Breast cancer screening - recommend monthly self breast exam. Screening mammogram was ordered. Patient to schedule  2) Cervical cancer screening - Pap was done. ASCCP guidelines and rational discussed.  Patient opts for yearly screening interval  3) Routine healthcare maintenance including  managed by PCP.  4) Pelvic ultrasound and possible IUD insertion scheduled. Discussed other measures to control menorrhagia like Depo Provera, progestins, endometrial ablation. Explained how the progesterone IUD works to decrease flow. Also counseled on how the IUD is inserted and risks of perforation, expulsion, infection and irregular bleeding. Note: CBC in Feb 2020 was WNL s was TSH   Dalia Heading, CNM

## 2019-06-26 ENCOUNTER — Encounter: Payer: Self-pay | Admitting: Certified Nurse Midwife

## 2019-06-26 ENCOUNTER — Other Ambulatory Visit: Payer: Self-pay

## 2019-06-26 ENCOUNTER — Other Ambulatory Visit (HOSPITAL_COMMUNITY)
Admission: RE | Admit: 2019-06-26 | Discharge: 2019-06-26 | Disposition: A | Discharge status: Home / Self Care | Payer: 59 | Source: Ambulatory Visit | Attending: Certified Nurse Midwife | Admitting: Certified Nurse Midwife

## 2019-06-26 ENCOUNTER — Telehealth: Payer: Self-pay | Admitting: Certified Nurse Midwife

## 2019-06-26 ENCOUNTER — Ambulatory Visit (INDEPENDENT_AMBULATORY_CARE_PROVIDER_SITE_OTHER): Payer: 59 | Admitting: Certified Nurse Midwife

## 2019-06-26 VITALS — BP 140/80 | HR 89 | Ht 63.0 in | Wt 151.0 lb

## 2019-06-26 DIAGNOSIS — Z124 Encounter for screening for malignant neoplasm of cervix: Secondary | ICD-10-CM

## 2019-06-26 DIAGNOSIS — Z01419 Encounter for gynecological examination (general) (routine) without abnormal findings: Secondary | ICD-10-CM | POA: Diagnosis not present

## 2019-06-26 DIAGNOSIS — Z1239 Encounter for other screening for malignant neoplasm of breast: Secondary | ICD-10-CM

## 2019-06-26 DIAGNOSIS — N920 Excessive and frequent menstruation with regular cycle: Secondary | ICD-10-CM

## 2019-06-26 NOTE — Telephone Encounter (Signed)
8/31 at 230 for possible mirena insert with CLG

## 2019-07-01 DIAGNOSIS — E1165 Type 2 diabetes mellitus with hyperglycemia: Secondary | ICD-10-CM | POA: Diagnosis not present

## 2019-07-01 DIAGNOSIS — K219 Gastro-esophageal reflux disease without esophagitis: Secondary | ICD-10-CM | POA: Diagnosis not present

## 2019-07-01 DIAGNOSIS — E785 Hyperlipidemia, unspecified: Secondary | ICD-10-CM | POA: Diagnosis not present

## 2019-07-01 DIAGNOSIS — R635 Abnormal weight gain: Secondary | ICD-10-CM | POA: Diagnosis not present

## 2019-07-01 LAB — CYTOLOGY - PAP: Diagnosis: NEGATIVE

## 2019-07-02 DIAGNOSIS — K219 Gastro-esophageal reflux disease without esophagitis: Secondary | ICD-10-CM | POA: Diagnosis not present

## 2019-07-02 DIAGNOSIS — I10 Essential (primary) hypertension: Secondary | ICD-10-CM | POA: Diagnosis not present

## 2019-07-02 DIAGNOSIS — E1165 Type 2 diabetes mellitus with hyperglycemia: Secondary | ICD-10-CM | POA: Diagnosis not present

## 2019-07-02 NOTE — Telephone Encounter (Signed)
Noted. Mirena reserved for this patient.

## 2019-07-07 ENCOUNTER — Other Ambulatory Visit: Payer: Self-pay

## 2019-07-07 ENCOUNTER — Encounter: Payer: Self-pay | Admitting: Certified Nurse Midwife

## 2019-07-07 ENCOUNTER — Ambulatory Visit (INDEPENDENT_AMBULATORY_CARE_PROVIDER_SITE_OTHER): Payer: 59

## 2019-07-07 ENCOUNTER — Ambulatory Visit (INDEPENDENT_AMBULATORY_CARE_PROVIDER_SITE_OTHER): Payer: 59 | Admitting: Certified Nurse Midwife

## 2019-07-07 VITALS — BP 130/80 | HR 93 | Ht 63.0 in | Wt 154.4 lb

## 2019-07-07 DIAGNOSIS — N920 Excessive and frequent menstruation with regular cycle: Secondary | ICD-10-CM | POA: Diagnosis not present

## 2019-07-07 DIAGNOSIS — D252 Subserosal leiomyoma of uterus: Secondary | ICD-10-CM | POA: Diagnosis not present

## 2019-07-07 NOTE — Progress Notes (Signed)
HPI: Tonya Sampson is a 43 year old Caucasian/White female, [place], who presents after an ultrasound for a follow up.  She presented for her annual exam earlier this month with complaints of menorrhagia. Her menses are usually regular and her LMP was 06/12/2019. They occur every 28 days , they last 7-8 days , with 3-4 days of heavier flow with clots requiring tampon and pad changes every 2-3 hours. Her menses has been heavier for "years" but her menses have gotten a little longer and heavier over the last year. She has had no intermenstrual bleeding, but has had 2 menses that came earlier than usual this past year. She reports dysmenorrhea on the heavy days of her menses. She also has [place]. She is interested in a Mirena in treatment of her menorrhagia. Contraception currently is a vasectomy Past medical history is significant for diabetes and hypertension.   Ultrasound demonstrates The uterus is retroverted and measures 8.9 x 5.7 x 5.7 cm. Echo texture is homogenous with evidence of focal masses. Within the uterus is a fibroid  measuring: Fibroid 1: 30 x 16 x 27 mm subserosal anterior fundal  The Endometrium measures 5.5 mm. There is a questionable endometrial polyp  Right Ovary measures 3.8 x 1.6 x 2.2 cm. It is normal in appearance. Left Ovary measures 2.3 x 1.6 x 1.2 cm. It is normal in appearance. Survey of the adnexa demonstrates no adnexal masses. There is no free fluid in the cul de sac.  Impression: 1. There is a questionable endometrial polyp in the lower uterine segment measuring 6.7 x 3.6 x 6.0 mm 2. There is one subserosal fibroid seen.  3. Normal ovaries.    PMHx: She  has a past medical history of Allergic rhinitis, Chickenpox, Diabetes ([place]), GERD (gastroesophageal reflux disease), Hyperlipidemia associated with type 2 diabetes mellitus ([place]), and Hypertension. Also,  has a past surgical history that includes Lumbar disc surgery (2008) and Foot surgery  (2010)., family history includes Alcoholism in her father and another family member; Arthritis in an other family member; Arthritis/Rheumatoid in her brother and sister; Asthma in her son; Breast cancer in an other family member; Diabetes in her mother, sister, and another family member; Glaucoma in her mother; Heart disease in her father and another family member; Hyperlipidemia in an other family member; Hypertension in her brother, brother, brother, mother, sister, sister, and another family member; Kidney disease in an other family member; Renal Disease in her father; Uterine cancer in an other family member.,  reports that she has never smoked. She has never used smokeless tobacco. She reports current alcohol use of about 1.0 standard drinks of alcohol per week. She reports that she does not use drugs.  She has a current medication list which includes the following prescription(s): alprazolam, fifty50 glucose meter 2.0, glipizide, freestyle lite, insulin pen needle, losartan, metformin, ondansetron, ozempic (0.25 or 0.5 mg/dose), atorvastatin, and esomeprazole. Also, is allergic to canagliflozin.  ROS  Objective: BP 130/80    Pulse 93    Ht 5' 3"  (1.6 m)    Wt 154 lb 6.4 oz (70 kg)    LMP 06/29/2019 (Exact Date)    BMI 27.35 kg/m   Physical examination Constitutional NAD, Conversant  Skin No rashes, lesions or ulceration.   Extremities: Moves all appropriately.  Normal ROM for age. No lymphadenopathy.  Neuro: Grossly intact  Psych: Oriented to PPT.  Normal mood. Normal affect.   Assessment:  Menorrhagia with possible endometrial polyp Fibroid seen but appears  subserosal and probably not adding to menorrhagia   Plan: Explained findings to [facility] Refer to Dr Kenton Kingfisher for hysteroscopy/ polypectomy and Mirena IUD insertion.  Dalia Heading, CNM

## 2019-07-08 DIAGNOSIS — N920 Excessive and frequent menstruation with regular cycle: Secondary | ICD-10-CM | POA: Insufficient documentation

## 2019-07-08 DIAGNOSIS — H40053 Ocular hypertension, bilateral: Secondary | ICD-10-CM | POA: Diagnosis not present

## 2019-07-15 DIAGNOSIS — E113293 Type 2 diabetes mellitus with mild nonproliferative diabetic retinopathy without macular edema, bilateral: Secondary | ICD-10-CM | POA: Diagnosis not present

## 2019-07-18 ENCOUNTER — Other Ambulatory Visit: Payer: Self-pay

## 2019-07-18 ENCOUNTER — Encounter: Payer: Self-pay | Admitting: Obstetrics & Gynecology

## 2019-07-18 ENCOUNTER — Ambulatory Visit (INDEPENDENT_AMBULATORY_CARE_PROVIDER_SITE_OTHER): Payer: 59 | Admitting: Obstetrics & Gynecology

## 2019-07-18 VITALS — BP 140/80 | Ht 64.0 in | Wt 153.0 lb

## 2019-07-18 DIAGNOSIS — N920 Excessive and frequent menstruation with regular cycle: Secondary | ICD-10-CM | POA: Diagnosis not present

## 2019-07-18 DIAGNOSIS — N840 Polyp of corpus uteri: Secondary | ICD-10-CM | POA: Diagnosis not present

## 2019-07-18 NOTE — Progress Notes (Signed)
HPI:      [name] is a 43 y.o. [id number] who LMP was Patient's last menstrual period was 06/29/2019 (exact date)., presents today for a problem visit.  She complains of menorrhagia that  began several months ago and its severity is described as severe.  She has regular periods every 28 days and they are associated with severe menstrual cramping.  She has used the following for attempts at control: maxi pad and tampon.  Previous evaluation: ultrasound showing small fibroid and endometrial thickening suggestive of polyp.   She is single partner, contraception - vasectomy.  Hx of STDs: none. She is premenopausal.  PMHx: She  has a past medical history of Allergic rhinitis, Chickenpox, Diabetes ([place]), GERD (gastroesophageal reflux disease), Hyperlipidemia associated with type 2 diabetes mellitus ([place]), and Hypertension. Also,  has a past surgical history that includes Lumbar disc surgery (2008) and Foot surgery (2010)., family history includes Alcoholism in her father and another family member; Arthritis in an other family member; Arthritis/Rheumatoid in her brother and sister; Asthma in her son; Breast cancer in an other family member; Diabetes in her mother, sister, and another family member; Glaucoma in her mother; Heart disease in her father and another family member; Hyperlipidemia in an other family member; Hypertension in her brother, brother, brother, mother, sister, sister, and another family member; Kidney disease in an other family member; Renal Disease in her father; Uterine cancer in an other family member.,  reports that she has never smoked. She has never used smokeless tobacco. She reports current alcohol use of about 1.0 standard drinks of alcohol per week. She reports that she does not use drugs.  She  Current Outpatient Medications:  .  ALPRAZolam (XANAX) 0.25 MG tablet, Take 1 tablet by mouth as needed., Disp: , Rfl:  .  Blood Glucose Monitoring Suppl (FIFTY50 GLUCOSE METER  2.0) w/Device KIT, Apply 1 Device topically 2 (two) times daily., Disp: , Rfl:  .  glipiZIDE (GLUCOTROL) 10 MG tablet, Take 1 tablet (10 mg total) by mouth 2 (two) times daily before a meal., Disp: 60 tablet, Rfl: 3 .  glucose blood (FREESTYLE LITE) test strip, Apply 1 Device topically 2 (two) times daily., Disp: , Rfl:  .  Insulin Pen Needle (B-D ULTRAFINE III SHORT PEN) 31G X 8 MM MISC, USE AS DIRECTED, Disp: , Rfl:  .  losartan (COZAAR) 25 MG tablet, Take 1 tablet by mouth daily., Disp: , Rfl:  .  metFORMIN (GLUCOPHAGE) 1000 MG tablet, Take by mouth., Disp: , Rfl:  .  ondansetron (ZOFRAN) 4 MG tablet, Take by mouth., Disp: , Rfl:  .  OZEMPIC 0.25 or 0.5 MG/DOSE SOPN, , Disp: , Rfl: 5 .  atorvastatin (LIPITOR) 10 MG tablet, Take by mouth., Disp: , Rfl:  .  esomeprazole (NEXIUM) 40 MG capsule, Take by mouth., Disp: , Rfl:   Also, is allergic to canagliflozin.  Review of Systems  All other systems reviewed and are negative.   Objective: BP 140/80   Ht 5' 4"  (1.626 m)   Wt 153 lb (69.4 kg)   LMP 06/29/2019 (Exact Date)   BMI 26.26 kg/m  Physical Exam Constitutional:      General: She is not in acute distress.    Appearance: She is well-developed.  Musculoskeletal: Normal range of motion.  Neurological:     Mental Status: She is alert and oriented to person, place, and time.  Skin:    General: Skin is warm and dry.  Vitals signs reviewed.  ASSESSMENT/PLAN:  dysfunctional uterine bleeding and menorrhagia   ICD-10-CM   1. Menorrhagia with regular cycle  N92.0   2. Endometrial polyp  N84.0    Options discussed for management in detail- D&C, Polypectomy, Ablation, Mirena, OCP, no treatment all are options. Desires Hysteroscopy D&C w Ablation.  Info provided. Consents obtained today.  Patient was told that it is normal to have menstrual bleeding after an endometrial ablation, only about 80% of patients become amenorrheic, 10% of patients have normal or light periods, and 10%  of patients have no change in their bleeding pattern and may need further intervention.  She was told she will observe her periods for a few months after her ablation to see what her periods will be like; it is recommended to wait until at least three months after the procedure before making conclusions about how periods are going to be like after an ablation.  A total of 15 minutes were spent face-to-face with the patient during this encounter and over half of that time dealt with counseling and coordination of care.   Barnett Applebaum, MD, [facility], [facility] 07/18/2019  11:00 AM

## 2019-07-18 NOTE — Progress Notes (Signed)
PRE-OPERATIVE HISTORY AND PHYSICAL EXAM  HPI:  Tonya Sampson is a 43 y.o. [id number] Patient's last menstrual period was 06/29/2019 (exact date).; she is being admitted for surgery related to abnormal uterine bleeding.  Pt has heavy periods and pain, and noted endometrial thickening suggestive of polyp.  PMHx: Past Medical History:  Diagnosis Date  . Allergic rhinitis   . Chickenpox   . Diabetes ([place])   . GERD (gastroesophageal reflux disease)   . Hyperlipidemia associated with type 2 diabetes mellitus ([place])   . Hypertension    Past Surgical History:  Procedure Laterality Date  . FOOT SURGERY  2010   nodule removed from right foot  . LUMBAR DISC SURGERY  2008   discectomy ([facility])   Family History  Problem Relation Age of Onset  . Alcoholism Other   . Arthritis Other   . Uterine cancer Other   . Hyperlipidemia Other   . Heart disease Other   . Kidney disease Other   . Hypertension Other   . Diabetes Other   . Breast cancer Other   . Diabetes Mother   . Hypertension Mother   . Glaucoma Mother   . Heart disease Father        CHF  . Alcoholism Father   . Renal Disease Father   . Hypertension Sister   . Arthritis/Rheumatoid Sister   . Hypertension Brother   . Asthma Son   . Hypertension Sister   . Diabetes Sister   . Hypertension Brother   . Arthritis/Rheumatoid Brother   . Hypertension Brother    Social History   Tobacco Use  . Smoking status: Never Smoker  . Smokeless tobacco: Never Used  Substance Use Topics  . Alcohol use: Yes    Alcohol/week: 1.0 standard drinks    Types: 1 Standard drinks or equivalent per week  . Drug use: No    Current Outpatient Medications:  .  ALPRAZolam (XANAX) 0.25 MG tablet, Take 1 tablet by mouth as needed., Disp: , Rfl:  .  Blood Glucose Monitoring Suppl (FIFTY50 GLUCOSE METER 2.0) w/Device KIT, Apply 1 Device topically 2 (two) times daily., Disp: , Rfl:  .  glipiZIDE (GLUCOTROL) 10 MG tablet, Take 1 tablet (10 mg total) by  mouth 2 (two) times daily before a meal., Disp: 60 tablet, Rfl: 3 .  glucose blood (FREESTYLE LITE) test strip, Apply 1 Device topically 2 (two) times daily., Disp: , Rfl:  .  Insulin Pen Needle (B-D ULTRAFINE III SHORT PEN) 31G X 8 MM MISC, USE AS DIRECTED, Disp: , Rfl:  .  losartan (COZAAR) 25 MG tablet, Take 1 tablet by mouth daily., Disp: , Rfl:  .  metFORMIN (GLUCOPHAGE) 1000 MG tablet, Take by mouth., Disp: , Rfl:  .  ondansetron (ZOFRAN) 4 MG tablet, Take by mouth., Disp: , Rfl:  .  OZEMPIC 0.25 or 0.5 MG/DOSE SOPN, , Disp: , Rfl: 5 .  atorvastatin (LIPITOR) 10 MG tablet, Take by mouth., Disp: , Rfl:  .  esomeprazole (NEXIUM) 40 MG capsule, Take by mouth., Disp: , Rfl:  Allergies: Canagliflozin  Review of Systems  Constitutional: Negative for chills, fever and malaise/fatigue.  HENT: Negative for congestion, sinus pain and sore throat.   Eyes: Negative for blurred vision and pain.  Respiratory: Negative for cough and wheezing.   Cardiovascular: Negative for chest pain and leg swelling.  Gastrointestinal: Negative for abdominal pain, constipation, diarrhea, heartburn, nausea and vomiting.  Genitourinary: Negative for dysuria, frequency, hematuria and urgency.  Musculoskeletal: Negative for back pain, joint pain, myalgias and neck pain.  Skin: Negative for itching and rash.  Neurological: Negative for dizziness, tremors and weakness.  Endo/Heme/Allergies: Does not bruise/bleed easily.  Psychiatric/Behavioral: Negative for depression. The patient is not nervous/anxious and does not have insomnia.     Objective: BP 140/80   Ht 5' 4"  (1.626 m)   Wt 153 lb (69.4 kg)   LMP 06/29/2019 (Exact Date)   BMI 26.26 kg/m   Filed Weights   07/18/19 1013  Weight: 153 lb (69.4 kg)   Physical Exam Constitutional:      General: She is not in acute distress.    Appearance: She is well-developed.  HENT:     Head: Normocephalic and atraumatic. No laceration.     Right Ear: Hearing normal.      Left Ear: Hearing normal.     Mouth/Throat:     Pharynx: Uvula midline.  Eyes:     Pupils: Pupils are equal, round, and reactive to light.  Neck:     Musculoskeletal: Normal range of motion and neck supple.     Thyroid: No thyromegaly.  Cardiovascular:     Rate and Rhythm: Normal rate and regular rhythm.     Heart sounds: No murmur. No friction rub. No gallop.   Pulmonary:     Effort: Pulmonary effort is normal. No respiratory distress.     Breath sounds: Normal breath sounds. No wheezing.  Chest:     Breasts:        Right: No mass, skin change or tenderness.        Left: No mass, skin change or tenderness.  Abdominal:     General: Bowel sounds are normal. There is no distension.     Palpations: Abdomen is soft.     Tenderness: There is no abdominal tenderness. There is no rebound.  Musculoskeletal: Normal range of motion.  Neurological:     Mental Status: She is alert and oriented to person, place, and time.     Cranial Nerves: No cranial nerve deficit.  Skin:    General: Skin is warm and dry.  Psychiatric:        Judgment: Judgment normal.  Vitals signs reviewed.     Assessment: 1. Menorrhagia with regular cycle   2. Endometrial polyp   Plan Hysteroscopy D&C w Polypectomy and Ablation  I have had a careful discussion with this patient about all the options available and the risk/benefits of each. I have fully informed this patient that surgery may subject her to a variety of discomforts and risks: She understands that most patients have surgery with little difficulty, but problems can happen ranging from minor to fatal. These include nausea, vomiting, pain, bleeding, infection, poor healing, hernia, or formation of adhesions. Unexpected reactions may occur from any drug or anesthetic given. Unintended injury may occur to other pelvic or abdominal structures such as Fallopian tubes, ovaries, bladder, ureter (tube from kidney to bladder), or bowel. Nerves going from the  pelvis to the legs may be injured. Any such injury may require immediate or later additional surgery to correct the problem. Excessive blood loss requiring transfusion is very unlikely but possible. Dangerous blood clots may form in the legs or lungs. Physical and sexual activity will be restricted in varying degrees for an indeterminate period of time but most often 2-6 weeks.  Finally, she understands that it is impossible to list every possible undesirable effect and that the condition for which surgery is done is  not always cured or significantly improved, and in rare cases may be even worse.Ample time was given to answer all questions.  Barnett Applebaum, MD, [facility], [facility] 07/18/2019  11:16 AM

## 2019-07-18 NOTE — Patient Instructions (Addendum)
PRE ADMISSION TESTING For Covid, prior to procedure Friday 9/18 9:00-10:00 [facility] entrance (drive up)  Results in 48-72 hours You will not receive notification if test results are negative. If positive for Covid19, your provider will notify you by phone, with additional instructions.   Hysteroscopy Hysteroscopy is a procedure that is used to examine the inside of a woman's womb (uterus). This may be done for various reasons, including:  To look for lumps (tumors) and other growths in the uterus.  To evaluate abnormal bleeding, fibroid tumors, polyps, scar tissue (adhesions), or cancer of the uterus.  To determine the cause of an inability to get pregnant (infertility) or repeated losses of pregnancies (miscarriages).  To find a lost IUD (intrauterine device).  To perform a procedure that permanently prevents pregnancy (sterilization). During this procedure, a thin, flexible tube with a small light and camera (hysteroscope) is used to examine the uterus. The camera sends images to a monitor in the room so that your health care provider can view the inside of your uterus. A hysteroscopy should be done right after a menstrual period to make sure that you are not pregnant. Tell a health care provider about:  Any allergies you have.  All medicines you are taking, including vitamins, herbs, eye drops, creams, and over-the-counter medicines.  Any problems you or family members have had with the use of anesthetic medicines.  Any blood disorders you have.  Any surgeries you have had.  Any medical conditions you have.  Whether you are pregnant or may be pregnant. What are the risks? Generally, this is a safe procedure. However, problems may occur, including:  Excessive bleeding.  Infection.  Damage to the uterus or other structures or organs.  Allergic reaction to medicines or fluids that are used in the procedure. What happens before the procedure? Staying  hydrated Follow instructions from your health care provider about hydration, which may include:  Up to 2 hours before the procedure - you may continue to drink clear liquids, such as water, clear fruit juice, black coffee, and plain tea. Eating and drinking restrictions Follow instructions from your health care provider about eating and drinking, which may include:  8 hours before the procedure - stop eating solid foods and drink clear liquids only  2 hours before the procedure - stop drinking clear liquids. General instructions  Ask your health care provider about: ? Changing or stopping your normal medicines. This is important if you take diabetes medicines or blood thinners. ? Taking medicines such as aspirin and ibuprofen. These medicines can thin your blood and cause bleeding. Do not take these medicines for 1 week before your procedure, or as told by your health care provider.  Do not use any products that contain nicotine or tobacco for 2 weeks before the procedure. This includes cigarettes and e-cigarettes. If you need help quitting, ask your health care provider.  Medicine may be placed in your cervix the day before the procedure. This medicine causes the cervix to have a larger opening (dilate). The larger opening makes it easier for the hysteroscope to be inserted into the uterus during the procedure.  Plan to have someone with you for the first 24-48 hours after the procedure, especially if you are given a medicine to make you fall asleep (general anesthetic).  Plan to have someone take you home from the hospital or clinic. What happens during the procedure?  To lower your risk of infection: ? Your health care team will wash or  sanitize their hands. ? Your skin will be washed with soap. ? Hair may be removed from the surgical area.  An IV tube will be inserted into one of your veins.  You may be given one or more of the following: ? A medicine to help you relax  (sedative). ? A medicine that numbs the area around the cervix (local anesthetic). ? A medicine to make you fall asleep (general anesthetic).  A hysteroscope will be inserted through your vagina and into your uterus.  Air or fluid will be used to enlarge your uterus, enabling your health care provider to see your uterus better. The amount of fluid used will be carefully checked throughout the procedure.  In some cases, tissue may be gently scraped from inside the uterus and sent to a lab for testing (biopsy). The procedure may vary among health care providers and hospitals. What happens after the procedure?  Your blood pressure, heart rate, breathing rate, and blood oxygen level will be monitored until the medicines you were given have worn off.  You may have some cramping. You may be given medicines for this.  You may have bleeding, which varies from light spotting to menstrual-like bleeding. This is normal.  If you had a biopsy done, it is your responsibility to get the results of your procedure. Ask your health care provider, or the department performing the procedure, when your results will be ready. Summary  Hysteroscopy is a procedure that is used to examine the inside of a woman's womb (uterus).  After the procedure, you may have bleeding, which varies from light spotting to menstrual-like bleeding. This is normal. You may also have cramping.  Plan to have someone take you home from the hospital or clinic. This information is not intended to replace advice given to you by your health care provider. Make sure you discuss any questions you have with your health care provider. Document Released: 01/29/2001 Document Revised: 10/05/2017 Document Reviewed: 11/21/2016 Elsevier Patient Education  2020 [employer].   Endometrial Ablation Endometrial ablation is a procedure that destroys the thin inner layer of the lining of the uterus (endometrium). This procedure may be done:  To  stop heavy periods.  To stop bleeding that is causing anemia.  To control irregular bleeding.  To treat bleeding caused by small tumors (fibroids) in the endometrium. This procedure is often an alternative to major surgery, such as removal of the uterus and cervix (hysterectomy). As a result of this procedure:  You may not be able to have children. However, if you are premenopausal (you have not gone through menopause): ? You may still have a small chance of getting pregnant. ? You will need to use a reliable method of birth control after the procedure to prevent pregnancy.  You may stop having a menstrual period, or you may have only a small amount of bleeding during your period. Menstruation may return several years after the procedure. Tell a health care provider about:  Any allergies you have.  All medicines you are taking, including vitamins, herbs, eye drops, creams, and over-the-counter medicines.  Any problems you or family members have had with the use of anesthetic medicines.  Any blood disorders you have.  Any surgeries you have had.  Any medical conditions you have. What are the risks? Generally, this is a safe procedure. However, problems may occur, including:  A hole (perforation) in the uterus or bowel.  Infection of the uterus, bladder, or vagina.  Bleeding.  Damage to  other structures or organs.  An air bubble in the lung (air embolus).  Problems with pregnancy after the procedure.  Failure of the procedure.  Decreased ability to diagnose cancer in the endometrium. What happens before the procedure?  You will have tests of your endometrium to make sure there are no pre-cancerous cells or cancer cells present.  You may have an ultrasound of the uterus.  You may be given medicines to thin the endometrium.  Ask your health care provider about: ? Changing or stopping your regular medicines. This is especially important if you take diabetes medicines  or blood thinners. ? Taking medicines such as aspirin and ibuprofen. These medicines can thin your blood. Do not take these medicines before your procedure if your doctor tells you not to.  Plan to have someone take you home from the hospital or clinic. What happens during the procedure?   You will lie on an exam table with your feet and legs supported as in a pelvic exam.  To lower your risk of infection: ? Your health care team will wash or sanitize their hands and put on germ-free (sterile) gloves. ? Your genital area will be washed with soap.  An IV tube will be inserted into one of your veins.  You will be given a medicine to help you relax (sedative).  A surgical instrument with a light and camera (resectoscope) will be inserted into your vagina and moved into your uterus. This allows your surgeon to see inside your uterus.  Endometrial tissue will be removed using one of the following methods: ? Radiofrequency. This method uses a radiofrequency-alternating electric current to remove the endometrium. ? Cryotherapy. This method uses extreme cold to freeze the endometrium. ? Heated-free liquid. This method uses a heated saltwater (saline) solution to remove the endometrium. ? Microwave. This method uses high-energy microwaves to heat up the endometrium and remove it. ? Thermal balloon. This method involves inserting a catheter with a balloon tip into the uterus. The balloon tip is filled with heated fluid to remove the endometrium. The procedure may vary among health care providers and hospitals. What happens after the procedure?  Your blood pressure, heart rate, breathing rate, and blood oxygen level will be monitored until the medicines you were given have worn off.  As tissue healing occurs, you may notice vaginal bleeding for 4-6 weeks after the procedure. You may also experience: ? Cramps. ? Thin, watery vaginal discharge that is light pink or brown in color. ? A need to  urinate more frequently than usual. ? Nausea.  Do not drive for 24 hours if you were given a sedative.  Do not have sex or insert anything into your vagina until your health care provider approves. Summary  Endometrial ablation is done to treat the many causes of heavy menstrual bleeding.  The procedure may be done only after medications have been tried to control the bleeding.  Plan to have someone take you home from the hospital or clinic. This information is not intended to replace advice given to you by your health care provider. Make sure you discuss any questions you have with your health care provider. Document Released: 09/01/2004 Document Revised: 04/09/2018 Document Reviewed: 11/09/2016 Elsevier Patient Education  2020 [employer].

## 2019-07-21 ENCOUNTER — Telehealth: Payer: Self-pay | Admitting: Obstetrics & Gynecology

## 2019-07-21 NOTE — Telephone Encounter (Signed)
Patient rescheduled surgery for the first week of November. Patient is aware of H&P at [facility] on 09/08/19 @ 8:00am w/ Dr. Kenton Kingfisher, Pre-admit testing phone interview to be rescheduled, Covid testing on 09/08/19 @ 8-10:30am, and OR on 09/11/19.

## 2019-07-24 ENCOUNTER — Inpatient Hospital Stay: Admission: RE | Admit: 2019-07-24 | Payer: 59 | Source: Ambulatory Visit

## 2019-07-25 ENCOUNTER — Other Ambulatory Visit: Payer: 59

## 2019-08-11 ENCOUNTER — Ambulatory Visit: Payer: 59 | Admitting: Obstetrics & Gynecology

## 2019-09-05 ENCOUNTER — Inpatient Hospital Stay: Admission: RE | Admit: 2019-09-05 | Payer: 59 | Source: Ambulatory Visit

## 2019-09-08 ENCOUNTER — Encounter
Admission: RE | Admit: 2019-09-08 | Discharge: 2019-09-08 | Disposition: A | Discharge status: Home / Self Care | Payer: 59 | Source: Ambulatory Visit | Attending: Obstetrics & Gynecology | Admitting: Obstetrics & Gynecology

## 2019-09-08 ENCOUNTER — Other Ambulatory Visit: Admission: RE | Admit: 2019-09-08 | Payer: 59 | Source: Ambulatory Visit

## 2019-09-08 ENCOUNTER — Other Ambulatory Visit: Payer: Self-pay

## 2019-09-08 ENCOUNTER — Encounter: Payer: 59 | Admitting: Obstetrics & Gynecology

## 2019-09-08 HISTORY — DX: Claustrophobia: F40.240

## 2019-09-08 NOTE — Pre-Procedure Instructions (Signed)
Pt had covid testing at the community site on 09/08/2019

## 2019-09-08 NOTE — Patient Instructions (Signed)
Your procedure is scheduled on: 09/11/19 Report to [place]. To find out your arrival time please call [phone] between 1PM - 3PM on 09/10/19.  Remember: Instructions that are not followed completely may result in serious medical risk, up to and including death, or upon the discretion of your surgeon and anesthesiologist your surgery may need to be rescheduled.     _X__ 1. Do not eat food after midnight the night before your procedure.                 No gum chewing or hard candies. You may drink clear liquids up to 2 hours                 before you are scheduled to arrive for your surgery- DO not drink clear                 liquids within 2 hours of the start of your surgery.                 Clear Liquids include:  water, apple juice without pulp, clear carbohydrate                 drink such as Clearfast or Gatorade, Black Coffee or Tea (Do not add                 anything to coffee or tea). Diabetics water only  __X__2.  On the morning of surgery brush your teeth with toothpaste and water, you                 may rinse your mouth with mouthwash if you wish.  Do not swallow any              toothpaste of mouthwash.     _X__ 3.  No Alcohol for 24 hours before or after surgery.   _X__ 4.  Do Not Smoke or use e-cigarettes For 24 Hours Prior to Your Surgery.                 Do not use any chewable tobacco products for at least 6 hours prior to                 surgery.  ____  5.  Bring all medications with you on the day of surgery if instructed.   __X__  6.  Notify your doctor if there is any change in your medical condition      (cold, fever, infections).     Do not wear jewelry, make-up, hairpins, clips or nail polish. Do not wear lotions, powders, or perfumes.  Do not shave 48 hours prior to surgery. Men may shave face and neck. Do not bring valuables to the hospital.    [facility] : [facility] is not responsible for any belongings or  valuables.  Contacts, dentures/partials or body piercings may not be worn into surgery. Bring a case for your contacts, glasses or hearing aids, a denture cup will be supplied. Leave your suitcase in the car. After surgery it may be brought to your room. For patients admitted to the hospital, discharge time is determined by your treatment team.   Patients discharged the day of surgery will not be allowed to drive home.   Please read over the following fact sheets that you were given:   MRSA Information  __X__ Take these medicines the morning of surgery with A SIP OF WATER:  1. esomeprazole ([place]  2.   3.   4.  5.  6.  ____ Fleet Enema (as directed)   __X__ Use CHG Soap/SAGE wipes as directed  ____ Use inhalers on the day of surgery  __x__ Stop metformin/Janumet/Farxiga 2 days prior to surgery    ____ Take 1/2 of usual insulin dose the night before surgery. No insulin the morning          of surgery.   ____ Stop Blood Thinners Coumadin/Plavix/Xarelto/Pleta/Pradaxa/Eliquis/Effient/Aspirin  on   Or contact your Surgeon, Cardiologist or Medical Doctor regarding  ability to stop your blood thinners  __X__ Stop Anti-inflammatories 7 days before surgery such as Advil, Ibuprofen, Motrin,  BC or Goodies Powder, Naprosyn, Naproxen, Aleve, Aspirin    __X__ Stop all herbal supplements, fish oil or vitamin E until after surgery.    ____ Bring C-Pap to the hospital.

## 2019-09-11 ENCOUNTER — Other Ambulatory Visit: Payer: Self-pay

## 2019-09-11 ENCOUNTER — Ambulatory Visit
Admission: RE | Admit: 2019-09-11 | Discharge: 2019-09-11 | Disposition: A | Discharge status: Home / Self Care | Payer: 59 | Attending: Obstetrics & Gynecology | Admitting: Obstetrics & Gynecology

## 2019-09-11 ENCOUNTER — Encounter
Admission: RE | Disposition: A | Discharge status: Home / Self Care | Payer: Self-pay | Source: Home / Self Care | Attending: Obstetrics & Gynecology

## 2019-09-11 ENCOUNTER — Ambulatory Visit: Payer: 59 | Admitting: Anesthesiology

## 2019-09-11 ENCOUNTER — Encounter: Payer: Self-pay | Admitting: *Deleted

## 2019-09-11 DIAGNOSIS — E1169 Type 2 diabetes mellitus with other specified complication: Secondary | ICD-10-CM | POA: Diagnosis not present

## 2019-09-11 DIAGNOSIS — Z794 Long term (current) use of insulin: Secondary | ICD-10-CM | POA: Diagnosis not present

## 2019-09-11 DIAGNOSIS — Z79899 Other long term (current) drug therapy: Secondary | ICD-10-CM | POA: Diagnosis not present

## 2019-09-11 DIAGNOSIS — N920 Excessive and frequent menstruation with regular cycle: Secondary | ICD-10-CM | POA: Diagnosis not present

## 2019-09-11 DIAGNOSIS — F419 Anxiety disorder, unspecified: Secondary | ICD-10-CM | POA: Insufficient documentation

## 2019-09-11 DIAGNOSIS — N840 Polyp of corpus uteri: Secondary | ICD-10-CM | POA: Diagnosis not present

## 2019-09-11 DIAGNOSIS — I10 Essential (primary) hypertension: Secondary | ICD-10-CM | POA: Diagnosis not present

## 2019-09-11 DIAGNOSIS — E785 Hyperlipidemia, unspecified: Secondary | ICD-10-CM | POA: Insufficient documentation

## 2019-09-11 DIAGNOSIS — K219 Gastro-esophageal reflux disease without esophagitis: Secondary | ICD-10-CM | POA: Diagnosis not present

## 2019-09-11 HISTORY — PX: DILITATION & CURRETTAGE/HYSTROSCOPY WITH NOVASURE ABLATION: SHX5568

## 2019-09-11 LAB — GLUCOSE, CAPILLARY
Glucose-Capillary: 232 mg/dL — ABNORMAL HIGH (ref 70–99)
Glucose-Capillary: 168 mg/dL — ABNORMAL HIGH (ref 70–99)

## 2019-09-11 LAB — POCT PREGNANCY, URINE: Preg Test, Ur: NEGATIVE

## 2019-09-11 LAB — CBC
HCT: 40 % (ref 36.0–46.0)
Hemoglobin: 13.3 g/dL (ref 12.0–15.0)
MCH: 27.6 pg (ref 26.0–34.0)
MCHC: 33.3 g/dL (ref 30.0–36.0)
MCV: 83 fL (ref 80.0–100.0)
Platelets: 392 10*3/uL (ref 150–400)
RBC: 4.82 MIL/uL (ref 3.87–5.11)
RDW: 13 % (ref 11.5–15.5)
WBC: 9.4 10*3/uL (ref 4.0–10.5)
nRBC: 0 % (ref 0.0–0.2)

## 2019-09-11 LAB — TYPE AND SCREEN
ABO/RH(D): O NEG
Antibody Screen: NEGATIVE

## 2019-09-11 SURGERY — DILATATION & CURETTAGE/HYSTEROSCOPY WITH NOVASURE ABLATION
Anesthesia: General

## 2019-09-11 MED ORDER — ONDANSETRON HCL 4 MG/2ML IJ SOLN: 4.0000 mg | Freq: Once | INTRAMUSCULAR | Status: DC | PRN

## 2019-09-11 MED ORDER — KETOROLAC TROMETHAMINE 30 MG/ML IJ SOLN
INTRAMUSCULAR | Status: DC | PRN
  Administered 2019-09-11: 30 mg via INTRAVENOUS

## 2019-09-11 MED ORDER — FENTANYL CITRATE (PF) 100 MCG/2ML IJ SOLN
INTRAMUSCULAR | Status: DC | PRN
  Administered 2019-09-11 (×2): 50 ug via INTRAVENOUS

## 2019-09-11 MED ORDER — SODIUM CHLORIDE 0.9 % IV SOLN
INTRAVENOUS | Status: DC
  Administered 2019-09-11: 10:00:00 via INTRAVENOUS

## 2019-09-11 MED ORDER — LIDOCAINE HCL (CARDIAC) PF 100 MG/5ML IV SOSY
PREFILLED_SYRINGE | INTRAVENOUS | Status: DC | PRN
  Administered 2019-09-11: 100 mg via INTRAVENOUS

## 2019-09-11 MED ORDER — ONDANSETRON HCL 4 MG/2ML IJ SOLN
INTRAMUSCULAR | Status: DC | PRN
  Administered 2019-09-11: 4 mg via INTRAVENOUS

## 2019-09-11 MED ORDER — FENTANYL CITRATE (PF) 100 MCG/2ML IJ SOLN
INTRAMUSCULAR | Status: AC
  Filled 2019-09-11: qty 2

## 2019-09-11 MED ORDER — LACTATED RINGERS IV SOLN: INTRAVENOUS | Status: DC

## 2019-09-11 MED ORDER — ACETAMINOPHEN NICU IV SYRINGE 10 MG/ML
INTRAVENOUS | Status: AC
  Filled 2019-09-11: qty 1

## 2019-09-11 MED ORDER — MIDAZOLAM HCL 2 MG/2ML IJ SOLN
INTRAMUSCULAR | Status: AC
  Filled 2019-09-11: qty 2

## 2019-09-11 MED ORDER — MORPHINE SULFATE (PF) 4 MG/ML IV SOLN: 1.0000 mg | INTRAVENOUS | Status: DC | PRN

## 2019-09-11 MED ORDER — MIDAZOLAM HCL 2 MG/2ML IJ SOLN
INTRAMUSCULAR | Status: DC | PRN
  Administered 2019-09-11 (×2): 1 mg via INTRAVENOUS

## 2019-09-11 MED ORDER — PROPOFOL 10 MG/ML IV BOLUS
INTRAVENOUS | Status: AC
  Filled 2019-09-11: qty 20

## 2019-09-11 MED ORDER — DEXAMETHASONE SODIUM PHOSPHATE 10 MG/ML IJ SOLN
INTRAMUSCULAR | Status: DC | PRN
  Administered 2019-09-11: 10 mg via INTRAVENOUS

## 2019-09-11 MED ORDER — OXYCODONE-ACETAMINOPHEN 5-325 MG PO TABS
1.0000 | ORAL_TABLET | ORAL | Status: DC | PRN
  Administered 2019-09-11: 12:00:00 1 via ORAL

## 2019-09-11 MED ORDER — ACETAMINOPHEN 10 MG/ML IV SOLN
INTRAVENOUS | Status: DC | PRN
  Administered 2019-09-11: 1000 mg via INTRAVENOUS

## 2019-09-11 MED ORDER — OXYCODONE-ACETAMINOPHEN 5-325 MG PO TABS: 1.0000 | ORAL_TABLET | ORAL | 0 refills | Status: DC | PRN

## 2019-09-11 MED ORDER — SEVOFLURANE IN SOLN
RESPIRATORY_TRACT | Status: AC
  Filled 2019-09-11: qty 250

## 2019-09-11 MED ORDER — ACETAMINOPHEN 650 MG RE SUPP
650.0000 mg | RECTAL | Status: DC | PRN
  Filled 2019-09-11: qty 1

## 2019-09-11 MED ORDER — FENTANYL CITRATE (PF) 100 MCG/2ML IJ SOLN
25.0000 ug | INTRAMUSCULAR | Status: DC | PRN
  Administered 2019-09-11 (×4): 25 ug via INTRAVENOUS

## 2019-09-11 MED ORDER — ACETAMINOPHEN 325 MG PO TABS: 650.0000 mg | ORAL_TABLET | ORAL | Status: DC | PRN

## 2019-09-11 MED ORDER — PROPOFOL 10 MG/ML IV BOLUS
INTRAVENOUS | Status: DC | PRN
  Administered 2019-09-11: 150 mg via INTRAVENOUS

## 2019-09-11 MED ORDER — OXYCODONE-ACETAMINOPHEN 5-325 MG PO TABS: 1.0000 | ORAL_TABLET | Freq: Once | ORAL | Status: DC

## 2019-09-11 MED ORDER — FENTANYL CITRATE (PF) 100 MCG/2ML IJ SOLN
INTRAMUSCULAR | Status: AC
  Administered 2019-09-11: 25 ug via INTRAVENOUS
  Filled 2019-09-11: qty 2

## 2019-09-11 MED ORDER — OXYCODONE-ACETAMINOPHEN 5-325 MG PO TABS
ORAL_TABLET | ORAL | Status: AC
  Filled 2019-09-11: qty 1

## 2019-09-11 SURGICAL SUPPLY — 15 items
BAG COUNTER SPONGE EZ (MISCELLANEOUS) ×2 IMPLANT
BAG SPNG 4X4 CLR HAZ (MISCELLANEOUS) ×1
CATH ROBINSON RED A/P 16FR (CATHETERS) ×2 IMPLANT
COVER WAND RF STERILE (DRAPES) IMPLANT
GLOVE BIO SURGEON STRL SZ8 (GLOVE) ×2 IMPLANT
GOWN STRL REUS W/ TWL LRG LVL3 (GOWN DISPOSABLE) ×1 IMPLANT
GOWN STRL REUS W/ TWL XL LVL3 (GOWN DISPOSABLE) ×1 IMPLANT
GOWN STRL REUS W/TWL LRG LVL3 (GOWN DISPOSABLE) ×2
GOWN STRL REUS W/TWL XL LVL3 (GOWN DISPOSABLE) ×2
KIT TURNOVER CYSTO (KITS) ×2 IMPLANT
PACK DNC HYST (MISCELLANEOUS) ×2 IMPLANT
PAD OB MATERNITY 4.3X12.25 (PERSONAL CARE ITEMS) ×2 IMPLANT
PAD PREP 24X41 OB/GYN DISP (PERSONAL CARE ITEMS) ×2 IMPLANT
SEAL ROD LENS SCOPE MYOSURE (ABLATOR) ×2 IMPLANT
TOWEL OR 17X26 4PK STRL BLUE (TOWEL DISPOSABLE) ×2 IMPLANT

## 2019-09-11 NOTE — Op Note (Signed)
Operative Note   09/11/2019  PRE-OP DIAGNOSIS: Menorrhagia   POST-OP DIAGNOSIS: same   SURGEON: Barnett Applebaum, MD, FACOG   PROCEDURE: Procedure(s): DILATATION & CURETTAGE/HYSTEROSCOPY W ENDOMETRIAL ABLATION  ANESTHESIA: General   ESTIMATED BLOOD LOSS: min   SPECIMENS: ECC, EMC   FLUID DEFICIT: min   COMPLICATIONS: none   DISPOSITION: PACU - hemodynamically stable.   CONDITION: stable   FINDINGS: Exam under anesthesia revealed small, mobile small uterus with no masses and bilateral adnexa without masses or fullness. Hysteroscopy revealed no polyps, otherwise grossly normal appearing uterine cavity with bilateral tubal ostia and normal appearing endocervical canal.   PROCEDURE IN DETAIL: After informed consent was obtained, the patient was taken to the operating room where anesthesia was obtained without difficulty. The patient was positioned in the dorsal lithotomy position in [place]. The patient's bladder was catheterized with an in and out foley catheter. The patient was examined under anesthesia, with the above noted findings. The weightedspeculum was placed inside the patient's vagina, and the the anterior lip of the cervix was seen and grasped with the tenaculum. An Endocervical specimen was obtained with a kevorkian curette. The uterine cavity was sounded to 7cm, and then the cervix was progressively dilated to a 18 French-Pratt dilator. The 30 degree hysteroscope was introduced, with LR fluid used to distend the intrauterine cavity, with the above noted findings.   The hystersocope was removed and the uterine cavity was curetted until a gritty texture was noted, yielding endometrial curettings. Repeat hysteroscopy performed, with improved contour and lining of uterus noted.  Excellent hemostasis was noted.   The Minerva endometrial ablation device was then placed without difficulty. Measurements were obtained. Patient was noted to have a uterine length of 7 cm, a cervical  length of 2.5 cm, and a cavity length of 4.5 cm. The device is first tested and after confirmation the procedures performed. Length of procedure was 120 seconds. The ablation device is then removed and repeat hysteroscopy reveals an appropriate lining of the uterus and no perforation or injury. Hysteroscope is removed with minimal discrepancy of fluid.   Tenaculum was removed with excellent hemostasis noted. She was then taken out of dorsal lithotomy. Minimal discrepancy in fluid was noted. No bleeding.   The patient tolerated the procedure well. Sponge, lap and needle counts were correct x2. The patient was taken to recovery room in excellent condition.  Barnett Applebaum, MD, [facility], [facility] 09/11/2019  11:20 AM

## 2019-09-11 NOTE — Transfer of Care (Signed)
Immediate Anesthesia Transfer of Care Note  Patient: Tonya Sampson  Procedure(s) Performed: DILATATION & CURETTAGE/HYSTEROSCOPY W ABLATION (N/A )  Patient Location: PACU  Anesthesia Type:General  Level of Consciousness: sedated  Airway & Oxygen Therapy: Patient Spontanous Breathing and Patient connected to nasal cannula oxygen  Post-op Assessment: Report given to RN and Post -op Vital signs reviewed and stable  Post vital signs: Reviewed and stable  Last Vitals:  Vitals Value Taken Time  BP 131/79 09/11/19 1123  Temp 36.7 C 09/11/19 1123  Pulse 99 09/11/19 1125  Resp 22 09/11/19 1125  SpO2 100 % 09/11/19 1125  Vitals shown include unvalidated device data.  Last Pain:  Vitals:   09/11/19 0759  TempSrc: Temporal  PainSc: 0-No pain         Complications: No apparent anesthesia complications

## 2019-09-11 NOTE — Anesthesia Preprocedure Evaluation (Signed)
Anesthesia Evaluation  Patient identified by MRN, date of birth, ID band Patient awake    Reviewed: Allergy & Precautions, NPO status , Patient's Chart, lab work & pertinent test results  History of Anesthesia Complications Negative for: history of anesthetic complications  Airway Mallampati: II       Dental   Pulmonary neg sleep apnea, neg COPD, Not current smoker,           Cardiovascular hypertension, Pt. on medications (-) Past MI and (-) CHF (-) dysrhythmias (-) Valvular Problems/Murmurs     Neuro/Psych neg Seizures Anxiety    GI/Hepatic Neg liver ROS, GERD  Medicated,  Endo/Other  diabetes, Type 2, Oral Hypoglycemic Agents  Renal/GU negative Renal ROS     Musculoskeletal   Abdominal   Peds  Hematology   Anesthesia Other Findings   Reproductive/Obstetrics                             Anesthesia Physical Anesthesia Plan  ASA: II  Anesthesia Plan: General   Post-op Pain Management:    Induction: Intravenous  PONV Risk Score and Plan: 3 and Dexamethasone, Ondansetron and Midazolam  Airway Management Planned: LMA  Additional Equipment:   Intra-op Plan:   Post-operative Plan:   Informed Consent: I have reviewed the patients History and Physical, chart, labs and discussed the procedure including the risks, benefits and alternatives for the proposed anesthesia with the patient or authorized representative who has indicated his/her understanding and acceptance.       Plan Discussed with:   Anesthesia Plan Comments:         Anesthesia Quick Evaluation

## 2019-09-11 NOTE — Anesthesia Procedure Notes (Signed)
Procedure Name: LMA Insertion Date/Time: 09/11/2019 10:45 AM Performed by: Johnna Acosta, CRNA Pre-anesthesia Checklist: Patient identified, Emergency Drugs available, Suction available, Patient being monitored and Timeout performed Patient Re-evaluated:Patient Re-evaluated prior to induction Oxygen Delivery Method: Circle system utilized Preoxygenation: Pre-oxygenation with 100% oxygen Induction Type: IV induction LMA: LMA inserted LMA Size: 4.0 Tube type: Oral Number of attempts: 1 Placement Confirmation: positive ETCO2 and breath sounds checked- equal and bilateral Tube secured with: Tape Dental Injury: Teeth and Oropharynx as per pre-operative assessment

## 2019-09-11 NOTE — Discharge Instructions (Signed)
Endometrial Ablation, Care After This sheet gives you information about how to care for yourself after your procedure. Your health care provider may also give you more specific instructions. If you have problems or questions, contact your health care provider. What can I expect after the procedure? After the procedure, it is common to have:  A need to urinate more frequently than usual for the first 24 hours.  Cramps similar to menstrual cramps. These may last for 1-2 days.  Thin, watery vaginal discharge that is light pink or brown in color. This may last a few weeks. Discharge will be heavy for the first few days after your procedure. You may need to wear a sanitary pad.  Nausea.  Vaginal bleeding for 4-6 weeks after the procedure, as tissue healing occurs. Follow these instructions at home: Activity  Do not drive for 24 hours if you were given amedicine to help you relax (sedative) during your procedure.  Do not have sex or put anything into your vagina until your health care provider approves.  Do not lift anything that is heavier than 10 lb (4.5 kg), or the limit that you are told, until your health care provider says that it is safe.  Return to your normal activities as told by your health care provider. Ask your health care provider what activities are safe for you. General instructions   Take over-the-counter and prescription medicines only as told by your health care provider.  Do not take baths, swim, or use a hot tub until your health care provider approves. You will be able to take showers.  Check your vaginal area every day for signs of infection. Check for: ? Redness, swelling, or pain. ? More discharge or blood, instead of less. ? Bad-smelling discharge.  Keep all follow-up visits as told by your health care provider. This is important.  Drink enough fluid to keep your urine pale yellow. Contact a health care provider if you have:  Vaginal redness, swelling, or  pain.  Vaginal discharge or bleeding that gets worse instead of getting better.  Bad-smelling vaginal discharge.  A fever or chills.  Trouble urinating. Get help right away if you have:  Heavy vaginal bleeding.  Severe cramps. Summary  After endometrial ablation, it is normal to have thin, watery vaginal discharge that is light pink or brown in color. This may last a few weeks and may be heavier right after the procedure.  Vaginal bleeding is also normal after the procedure and should get better with time.  Check your vaginal area every day for signs of infection, such as bad-smelling discharge.  Keep all follow-up visits as told by your health care provider. This is important. This information is not intended to replace advice given to you by your health care provider. Make sure you discuss any questions you have with your health care provider. Document Released: 09/04/2017 Document Revised: 02/13/2019 Document Reviewed: 09/04/2017 Elsevier Patient Education  2020 [place]   1) The drugs that you were given will stay in your system until tomorrow so for the next 24 hours you should not:  A) Drive an automobile B) Make any legal decisions C) Drink any alcoholic beverage   2) You may resume regular meals tomorrow.  Today it is better to start with liquids and gradually work up to solid foods.  You may eat anything you prefer, but it is better to start with liquids, then soup and crackers, and gradually work  up to solid foods.   3) Please notify your doctor immediately if you have any unusual bleeding, trouble breathing, redness and pain at the surgery site, drainage, fever, or pain not relieved by medication.    4) Additional Instructions:        Please contact your physician with any problems or Same Day Surgery at [phone], Monday through Friday 6 am to 4 pm, or [place] at [facility] number at  [phone].

## 2019-09-11 NOTE — Anesthesia Post-op Follow-up Note (Signed)
Anesthesia QCDR form completed.        

## 2019-09-11 NOTE — H&P (Signed)
History and Physical Interval Note:  09/11/2019 10:05 AM  Tonya Sampson  has presented today for surgery, with the diagnosis of Menorrhagia with regular cycle N92.0, Endometrial polyp N84.0.  The various methods of treatment have been discussed with the patient and family. After consideration of risks, benefits and other options for treatment, the patient has consented to  Procedure(s): [place] (N/A) as a surgical intervention .  The patient's history has been reviewed, patient examined, no change in status, stable for surgery.  Pt has the following beta blocker history-  Not taking Beta Blocker.  I have reviewed the patient's chart and labs.  Questions were answered to the patient's satisfaction.    See full H&P dated 07/18/2019.  Barnett Applebaum, MD, [facility], [facility] 09/11/2019  10:05 AM

## 2019-09-12 ENCOUNTER — Encounter: Payer: Self-pay | Admitting: Obstetrics & Gynecology

## 2019-09-12 LAB — SURGICAL PATHOLOGY

## 2019-09-12 NOTE — Anesthesia Postprocedure Evaluation (Signed)
Anesthesia Post Note  Patient: Tonya Sampson  Procedure(s) Performed: DILATATION & CURETTAGE/HYSTEROSCOPY W ABLATION (N/A )  Patient location during evaluation: PACU Anesthesia Type: General Level of consciousness: awake and alert Pain management: pain level controlled Vital Signs Assessment: post-procedure vital signs reviewed and stable Respiratory status: spontaneous breathing, nonlabored ventilation, respiratory function stable and patient connected to nasal cannula oxygen Cardiovascular status: blood pressure returned to baseline and stable Postop Assessment: no apparent nausea or vomiting Anesthetic complications: no     Last Vitals:  Vitals:   09/11/19 1209 09/11/19 1221  BP: (!) 156/74 (!) 150/79  Pulse: 86 87  Resp: 15 16  Temp:  36.4 C  SpO2: 98% 99%    Last Pain:  Vitals:   09/12/19 1037  TempSrc:   PainSc: 0-No pain                 Molli Barrows

## 2019-09-16 NOTE — H&P (Signed)
PRE-OPERATIVE HISTORY AND PHYSICAL EXAM for procedure 09/11/2019  HPI:  Tonya Sampson is a 43 y.o. [id number] Patient's last menstrual period was 08/19/2019 (exact date).; she is being admitted for surgery related to abnormal uterine bleeding.  PMHx: Past Medical History:  Diagnosis Date  . Allergic rhinitis   . Chickenpox   . Claustrophobia   . Diabetes ([place])   . GERD (gastroesophageal reflux disease)   . Hyperlipidemia associated with type 2 diabetes mellitus ([place])   . Hypertension    Past Surgical History:  Procedure Laterality Date  . DILITATION & CURRETTAGE/HYSTROSCOPY WITH NOVASURE ABLATION N/A 09/11/2019   Procedure: DILATATION & CURETTAGE/HYSTEROSCOPY W ABLATION;  Surgeon: Gae Dry, MD;  Location: [facility] ORS;  Service: Gynecology;  Laterality: N/A;  . FOOT SURGERY  2010   nodule removed from right foot  . LUMBAR DISC SURGERY  2008   discectomy (HNP)   Family History  Problem Relation Age of Onset  . Alcoholism Other   . Arthritis Other   . Uterine cancer Other   . Hyperlipidemia Other   . Heart disease Other   . Kidney disease Other   . Hypertension Other   . Diabetes Other   . Breast cancer Other   . Diabetes Mother   . Hypertension Mother   . Glaucoma Mother   . Heart disease Father        CHF  . Alcoholism Father   . Renal Disease Father   . Hypertension Sister   . Arthritis/Rheumatoid Sister   . Hypertension Brother   . Asthma Son   . Hypertension Sister   . Diabetes Sister   . Hypertension Brother   . Arthritis/Rheumatoid Brother   . Hypertension Brother    Social History   Tobacco Use  . Smoking status: Never Smoker  . Smokeless tobacco: Never Used  Substance Use Topics  . Alcohol use: Yes    Alcohol/week: 1.0 standard drinks    Types: 1 Standard drinks or equivalent per week  . Drug use: No   No current facility-administered medications for this encounter.   Current Outpatient Medications:  .  atorvastatin (LIPITOR) 10 MG tablet,  Take 10 mg by mouth every evening. , Disp: , Rfl:  .  Blood Glucose Monitoring Suppl (FIFTY50 GLUCOSE METER 2.0) w/Device KIT, Apply 1 Device topically 2 (two) times daily., Disp: , Rfl:  .  dextromethorphan (DELSYM) 30 MG/5ML liquid, Take 60 mg by mouth as needed for cough., Disp: , Rfl:  .  esomeprazole (NEXIUM) 40 MG capsule, Take 40 mg by mouth 2 (two) times daily. , Disp: , Rfl:  .  glipiZIDE (GLUCOTROL) 10 MG tablet, Take 1 tablet (10 mg total) by mouth 2 (two) times daily before a meal., Disp: 60 tablet, Rfl: 3 .  glucose blood (FREESTYLE LITE) test strip, Apply 1 Device topically 2 (two) times daily., Disp: , Rfl:  .  Insulin Pen Needle (B-D ULTRAFINE III SHORT PEN) 31G X 8 MM MISC, USE AS DIRECTED, Disp: , Rfl:  .  loratadine (CLARITIN) 10 MG tablet, Take 10 mg by mouth every evening., Disp: , Rfl:  .  losartan (COZAAR) 25 MG tablet, Take 25 mg by mouth every evening. , Disp: , Rfl:  .  metFORMIN (GLUCOPHAGE) 1000 MG tablet, Take 1,000 mg by mouth 2 (two) times daily. , Disp: , Rfl:  .  OZEMPIC 0.25 or 0.5 MG/DOSE SOPN, Inject 0.5 mg into the skin every Friday. , Disp: , Rfl: 5 .  oxyCODONE-acetaminophen (PERCOCET/ROXICET)  5-325 MG tablet, Take 1 tablet by mouth every 4 (four) hours as needed for moderate pain., Disp: 30 tablet, Rfl: 0 Allergies: Canagliflozin  Review of Systems  All other systems reviewed and are negative.   Objective: Exam from office and also date of surgery 09/11/2019 BP (!) 150/79   Pulse 87   Temp 97.6 F (36.4 C) (Temporal)   Resp 16   Ht _0  (1.6 m)   Wt 68 kg   LMP 08/19/2019 (Exact Date)   SpO2 99%   BMI 26.57 kg/m   Filed Weights   09/11/19 0759  Weight: 68 kg   Physical Exam Constitutional:      General: She is not in acute distress.    Appearance: She is well-developed.  HENT:     Head: Normocephalic and atraumatic. No laceration.     Right Ear: Hearing normal.     Left Ear: Hearing normal.     Mouth/Throat:     Pharynx: Uvula  midline.  Eyes:     Pupils: Pupils are equal, round, and reactive to light.  Neck:     Musculoskeletal: Normal range of motion and neck supple.     Thyroid: No thyromegaly.  Cardiovascular:     Rate and Rhythm: Normal rate and regular rhythm.     Heart sounds: No murmur. No friction rub. No gallop.   Pulmonary:     Effort: Pulmonary effort is normal. No respiratory distress.     Breath sounds: Normal breath sounds. No wheezing.  Chest:     Breasts:        Right: No mass, skin change or tenderness.        Left: No mass, skin change or tenderness.  Abdominal:     General: Bowel sounds are normal. There is no distension.     Palpations: Abdomen is soft.     Tenderness: There is no abdominal tenderness. There is no rebound.  Musculoskeletal: Normal range of motion.  Neurological:     Mental Status: She is alert and oriented to person, place, and time.     Cranial Nerves: No cranial nerve deficit.  Skin:    General: Skin is warm and dry.  Psychiatric:        Judgment: Judgment normal.  Vitals signs reviewed.     Assessment: Menorrhagia Plan Hysteroscopy and Ablation Pros and cons and alternatives emphasized again in preop prior to surgery  This H&P is an updated version from 07/18/2019 All was discussed and examined on her 11/5/ visit  Barnett Applebaum, MD, [facility], [facility] 09/16/2019  10:06 AM

## 2019-09-25 ENCOUNTER — Other Ambulatory Visit: Payer: Self-pay

## 2019-09-25 ENCOUNTER — Ambulatory Visit (INDEPENDENT_AMBULATORY_CARE_PROVIDER_SITE_OTHER): Payer: 59 | Admitting: Obstetrics & Gynecology

## 2019-09-25 ENCOUNTER — Encounter: Payer: Self-pay | Admitting: Obstetrics & Gynecology

## 2019-09-25 VITALS — BP 122/80 | Ht 63.0 in | Wt 155.0 lb

## 2019-09-25 DIAGNOSIS — N920 Excessive and frequent menstruation with regular cycle: Secondary | ICD-10-CM | POA: Diagnosis not present

## 2019-09-25 DIAGNOSIS — Z9889 Other specified postprocedural states: Secondary | ICD-10-CM | POA: Diagnosis not present

## 2019-09-25 DIAGNOSIS — N840 Polyp of corpus uteri: Secondary | ICD-10-CM | POA: Diagnosis not present

## 2019-09-25 NOTE — Progress Notes (Signed)
  Postoperative Follow-up Patient presents post op from operative hysteroscopy and Endometrial Ablation for abnormal uterine bleeding, 2 weeks ago. Path: DIAGNOSIS:  A. ENDOCERVIX; CURETTAGE:  - SUPERFICIAL PORTIONS OF BENIGN ENDO AND ECTOCERVICAL TISSUE.  - POLYPOID FRAGMENTS OF BENIGN LOWER UTERINE SEGMENT.  - NEGATIVE FOR ATYPICAL HYPERPLASIA/EIN, DYSPLASIA, AND MALIGNANCY.   B. ENDOMETRIUM; CURETTAGE:  - POLYPOID FRAGMENTS OF SECRETORY PHASE ENDOMETRIUM.  - SUPERFICIAL FRAGMENTS OF BENIGN CERVICAL TISSUE.  - NEGATIVE FOR ATYPICAL HYPERPLASIA/EIN, DYSPLASIA, AND MALIGNANCY.  Subjective: Patient reports marked improvement in her preop symptoms. Eating a regular diet without difficulty. The patient is not having any pain.  Activity: normal activities of daily living. Patient reports additional symptom's since surgery of No bleeding or disharge  Objective: BP 122/80   Ht 5' 3"  (1.6 m)   Wt 155 lb (70.3 kg)   BMI 27.46 kg/m  Physical Exam Constitutional:      General: She is not in acute distress.    Appearance: She is well-developed.  Cardiovascular:     Rate and Rhythm: Normal rate.  Pulmonary:     Effort: Pulmonary effort is normal.  Abdominal:     General: There is no distension.     Palpations: Abdomen is soft.     Tenderness: There is no abdominal tenderness.     Comments:    Musculoskeletal: Normal range of motion.  Neurological:     Mental Status: She is alert and oriented to person, place, and time.     Cranial Nerves: No cranial nerve deficit.  Skin:    General: Skin is warm and dry.     Assessment: s/p :  operative hysteroscopy and endometrial ablation progressing well  Plan: Patient has done well after surgery with no apparent complications.  I have discussed the post-operative course to date, and the expected progress moving forward.  The patient understands what complications to be concerned about.  I will see the patient in routine follow up, or sooner  if needed.    Activity plan: No restriction.   Hoyt Koch 09/25/2019, 10:43 AM

## 2019-11-03 DIAGNOSIS — E1165 Type 2 diabetes mellitus with hyperglycemia: Secondary | ICD-10-CM | POA: Diagnosis not present

## 2019-11-03 DIAGNOSIS — I10 Essential (primary) hypertension: Secondary | ICD-10-CM | POA: Diagnosis not present

## 2019-11-03 DIAGNOSIS — K219 Gastro-esophageal reflux disease without esophagitis: Secondary | ICD-10-CM | POA: Diagnosis not present

## 2019-12-16 DIAGNOSIS — R000 Tachycardia, unspecified: Secondary | ICD-10-CM | POA: Diagnosis not present

## 2019-12-16 DIAGNOSIS — G2581 Restless legs syndrome: Secondary | ICD-10-CM | POA: Diagnosis not present

## 2019-12-16 DIAGNOSIS — I10 Essential (primary) hypertension: Secondary | ICD-10-CM | POA: Diagnosis not present

## 2019-12-16 DIAGNOSIS — E1165 Type 2 diabetes mellitus with hyperglycemia: Secondary | ICD-10-CM | POA: Diagnosis not present

## 2019-12-16 DIAGNOSIS — Z8616 Personal history of COVID-19: Secondary | ICD-10-CM | POA: Diagnosis not present

## 2019-12-29 ENCOUNTER — Other Ambulatory Visit: Payer: Self-pay | Admitting: Internal Medicine

## 2019-12-29 DIAGNOSIS — Z1231 Encounter for screening mammogram for malignant neoplasm of breast: Secondary | ICD-10-CM

## 2019-12-31 ENCOUNTER — Other Ambulatory Visit: Payer: Self-pay

## 2019-12-31 ENCOUNTER — Ambulatory Visit
Admission: RE | Admit: 2019-12-31 | Discharge: 2019-12-31 | Disposition: A | Discharge status: Home / Self Care | Payer: 59 | Source: Ambulatory Visit | Attending: Internal Medicine | Admitting: Internal Medicine

## 2019-12-31 DIAGNOSIS — Z1231 Encounter for screening mammogram for malignant neoplasm of breast: Secondary | ICD-10-CM | POA: Diagnosis not present

## 2020-04-11 ENCOUNTER — Ambulatory Visit
Admission: EM | Admit: 2020-04-11 | Discharge: 2020-04-11 | Disposition: A | Discharge status: Home / Self Care | Payer: 59 | Attending: Family Medicine | Admitting: Family Medicine

## 2020-04-11 ENCOUNTER — Encounter: Payer: Self-pay | Admitting: Emergency Medicine

## 2020-04-11 ENCOUNTER — Other Ambulatory Visit: Payer: Self-pay

## 2020-04-11 DIAGNOSIS — E119 Type 2 diabetes mellitus without complications: Secondary | ICD-10-CM | POA: Insufficient documentation

## 2020-04-11 DIAGNOSIS — K219 Gastro-esophageal reflux disease without esophagitis: Secondary | ICD-10-CM | POA: Insufficient documentation

## 2020-04-11 DIAGNOSIS — I10 Essential (primary) hypertension: Secondary | ICD-10-CM | POA: Insufficient documentation

## 2020-04-11 DIAGNOSIS — Z7901 Long term (current) use of anticoagulants: Secondary | ICD-10-CM | POA: Diagnosis not present

## 2020-04-11 DIAGNOSIS — J069 Acute upper respiratory infection, unspecified: Secondary | ICD-10-CM | POA: Diagnosis not present

## 2020-04-11 DIAGNOSIS — Z794 Long term (current) use of insulin: Secondary | ICD-10-CM | POA: Insufficient documentation

## 2020-04-11 DIAGNOSIS — J029 Acute pharyngitis, unspecified: Secondary | ICD-10-CM | POA: Insufficient documentation

## 2020-04-11 DIAGNOSIS — Z79899 Other long term (current) drug therapy: Secondary | ICD-10-CM | POA: Diagnosis not present

## 2020-04-11 DIAGNOSIS — E785 Hyperlipidemia, unspecified: Secondary | ICD-10-CM | POA: Insufficient documentation

## 2020-04-11 DIAGNOSIS — Z20822 Contact with and (suspected) exposure to covid-19: Secondary | ICD-10-CM | POA: Insufficient documentation

## 2020-04-11 LAB — GROUP A STREP BY PCR: Group A Strep by PCR: NOT DETECTED

## 2020-04-11 MED ORDER — AMOXICILLIN 875 MG PO TABS: 875.0000 mg | ORAL_TABLET | Freq: Two times a day (BID) | ORAL | 0 refills | Status: AC

## 2020-04-11 NOTE — ED Triage Notes (Signed)
Patient c/o sinus congestion, cough, fever, dizziness and sore throat started on Friday.

## 2020-04-11 NOTE — ED Provider Notes (Signed)
[facility]    CSN: [id number] Arrival date & time: 04/11/20  0832      History   Chief Complaint Chief Complaint  Patient presents with  . Cough  . Fever  . Sore Throat    HPI Tonya Sampson is a 43 y.o. female.   43 yo female with a c/o sinus congestion, cough, fevers, dizziness and sore throat for the past 3 days. Denies any chest pains or shortness of breath. Has tried over the counter medications without relief. Patient works at the hospital. States she hasn't received the covid vaccine.    Cough Associated symptoms: fever   Fever Associated symptoms: cough   Sore Throat    Past Medical History:  Diagnosis Date  . Allergic rhinitis   . Chickenpox   . Claustrophobia   . Diabetes ([facility])   . GERD (gastroesophageal reflux disease)   . Hyperlipidemia associated with type 2 diabetes mellitus ([facility])   . Hypertension     Patient Active Problem List   Diagnosis Date Noted  . S/P endometrial ablation 09/25/2019  . Menorrhagia 07/08/2019  . Hyperlipidemia associated with type 2 diabetes mellitus ([facility])   . GERD (gastroesophageal reflux disease)   . Hypertension   . Viral upper respiratory infection 12/20/2015  . Diabetes ([facility]) 12/20/2015  . Benign nevus 12/20/2015  . Abnormal mammogram 12/20/2015    Past Surgical History:  Procedure Laterality Date  . DILITATION & CURRETTAGE/HYSTROSCOPY WITH NOVASURE ABLATION N/A 09/11/2019   Procedure: DILATATION & CURETTAGE/HYSTEROSCOPY W ABLATION;  Surgeon: Harris, Robert P, MD;  Location: [facility] ORS;  Service: Gynecology;  Laterality: N/A;  . FOOT SURGERY  2010   nodule removed from right foot  . LUMBAR DISC SURGERY  2008   discectomy (HNP)    OB History    Gravida  2   Para  2   Term  2   Preterm      AB      Living  2     SAB      TAB      Ectopic      Multiple      Live Births  2            Home Medications    Prior to Admission medications   Medication Sig Start Date End Date  Taking? Authorizing Provider  atorvastatin (LIPITOR) 10 MG tablet Take 10 mg by mouth every evening.  11/16/17 04/11/20 Yes [provider]  esomeprazole (NEXIUM) 40 MG capsule Take 40 mg by mouth 2 (two) times daily.  03/14/18 09/02/20 Yes [provider]  glipiZIDE (GLUCOTROL) 10 MG tablet Take 1 tablet (10 mg total) by mouth 2 (two) times daily before a meal. 12/20/15  Yes Sonnenberg, Eric G, MD  loratadine (CLARITIN) 10 MG tablet Take 10 mg by mouth every evening.   Yes [provider]  losartan (COZAAR) 25 MG tablet Take 25 mg by mouth every evening.  04/25/19  Yes [provider]  metFORMIN (GLUCOPHAGE) 1000 MG tablet Take 1,000 mg by mouth 2 (two) times daily.  11/16/17  Yes [provider]  amoxicillin (AMOXIL) 875 MG tablet Take 1 tablet (875 mg total) by mouth 2 (two) times daily. 04/11/20   Conty, [place], MD  Blood Glucose Monitoring Suppl (FIFTY50 GLUCOSE METER 2.0) w/Device KIT Apply 1 Device topically 2 (two) times daily. 06/03/18   [provider]  dextromethorphan (DELSYM) 30 MG/5ML liquid Take 60 mg by mouth as needed for cough.      [provider]  glucose blood (FREESTYLE LITE) test strip Apply 1 Device topically 2 (two) times daily. 12/30/18   [provider]  Insulin Pen Needle (B-D ULTRAFINE III SHORT PEN) 31G X 8 MM MISC USE AS DIRECTED 07/17/17   [provider]  OZEMPIC 0.25 or 0.5 MG/DOSE SOPN Inject 0.5 mg into the skin every Friday.  04/15/18   [provider]    Family History Family History  Problem Relation Age of Onset  . Alcoholism Other   . Arthritis Other   . Uterine cancer Other   . Hyperlipidemia Other   . Heart disease Other   . Kidney disease Other   . Hypertension Other   . Diabetes Other   . Breast cancer Other   . Diabetes Mother   . Hypertension Mother   . Glaucoma Mother   . Heart disease Father        CHF  . Alcoholism Father   . Renal Disease Father   .  Hypertension Sister   . Arthritis/Rheumatoid Sister   . Hypertension Brother   . Asthma Son   . Hypertension Sister   . Diabetes Sister   . Hypertension Brother   . Arthritis/Rheumatoid Brother   . Hypertension Brother     Social History Social History   Tobacco Use  . Smoking status: Never Smoker  . Smokeless tobacco: Never Used  Substance Use Topics  . Alcohol use: Yes    Alcohol/week: 1.0 standard drinks    Types: 1 Standard drinks or equivalent per week  . Drug use: No     Allergies   Canagliflozin   Review of Systems Review of Systems  Constitutional: Positive for fever.  Respiratory: Positive for cough.      Physical Exam Triage Vital Signs ED Triage Vitals  Enc Vitals Group     BP 04/11/20 0854 133/89     Pulse Rate 04/11/20 0854 80     Resp 04/11/20 0854 14     Temp 04/11/20 0854 97.7 F (36.5 C)     Temp Source 04/11/20 0854 Oral     SpO2 04/11/20 0854 100 %     Weight 04/11/20 0850 150 lb (68 kg)     Height 04/11/20 0850 5' 3" (1.6 m)     Head Circumference --      Peak Flow --      Pain Score 04/11/20 0850 2     Pain Loc --      Pain Edu? --      Excl. in [place]? --    No data found.  Updated Vital Signs BP 133/89 (BP Location: Right Arm)   Pulse 80   Temp 97.7 F (36.5 C) (Oral)   Resp 14   Ht 5' 3" (1.6 m)   Wt 68 kg   SpO2 100%   BMI 26.57 kg/m   Visual Acuity Right Eye Distance:   Left Eye Distance:   Bilateral Distance:    Right Eye Near:   Left Eye Near:    Bilateral Near:     Physical Exam Vitals and nursing note reviewed.  Constitutional:      General: She is not in acute distress.    Appearance: She is not toxic-appearing or diaphoretic.  HENT:     Ears:     Comments: TMs slightly erythematous bilaterally but not bulging    Nose: Congestion and rhinorrhea present.     Mouth/Throat:     Pharynx: Posterior oropharyngeal erythema present.  No oropharyngeal exudate.  Cardiovascular:     Rate and Rhythm: Normal rate.      Heart sounds: Normal heart sounds.  Pulmonary:     Effort: Pulmonary effort is normal. No respiratory distress.     Breath sounds: Normal breath sounds. No stridor. No wheezing, rhonchi or rales.  Neurological:     Mental Status: She is alert.      UC Treatments / Results  Labs (all labs ordered are listed, but only abnormal results are displayed) Labs Reviewed  SARS CORONAVIRUS 2 (TAT 6-24 HRS)  GROUP A STREP BY PCR    EKG   Radiology No results found.  Procedures Procedures (including critical care time)  Medications Ordered in UC Medications - No data to display  Initial Impression / Assessment and Plan / UC Course  I have reviewed the triage vital signs and the nursing notes.  Pertinent labs & imaging results that were available during my care of the patient were reviewed by me and considered in my medical decision making (see chart for details).      Final Clinical Impressions(s) / UC Diagnoses   Final diagnoses:  Upper respiratory tract infection, unspecified type     Discharge Instructions     Rest, fluids, over the counter medications as needed     ED Prescriptions    Medication Sig Dispense Auth. Provider   amoxicillin (AMOXIL) 875 MG tablet Take 1 tablet (875 mg total) by mouth 2 (two) times daily. 20 tablet Norval Gable, MD      1. diagnosis reviewed with patient 2. rx as per orders above; reviewed possible side effects, interactions, risks and benefits  3. Recommend supportive treatment as above 4. Follow-up prn if symptoms worsen or don't improve   PDMP not reviewed this encounter.   Norval Gable, MD 04/11/20 [phone]

## 2020-04-11 NOTE — Discharge Instructions (Signed)
Rest, fluids, over the counter medications as needed

## 2020-04-12 LAB — SARS CORONAVIRUS 2 (TAT 6-24 HRS): SARS Coronavirus 2: NEGATIVE

## 2020-04-29 ENCOUNTER — Other Ambulatory Visit: Payer: Self-pay | Admitting: Internal Medicine

## 2020-07-02 ENCOUNTER — Other Ambulatory Visit: Payer: Self-pay | Admitting: Internal Medicine

## 2020-07-06 DIAGNOSIS — I10 Essential (primary) hypertension: Secondary | ICD-10-CM | POA: Diagnosis not present

## 2020-07-06 DIAGNOSIS — G2581 Restless legs syndrome: Secondary | ICD-10-CM | POA: Diagnosis not present

## 2020-07-06 DIAGNOSIS — R000 Tachycardia, unspecified: Secondary | ICD-10-CM | POA: Diagnosis not present

## 2020-07-06 DIAGNOSIS — E1165 Type 2 diabetes mellitus with hyperglycemia: Secondary | ICD-10-CM | POA: Diagnosis not present

## 2020-07-08 DIAGNOSIS — M7652 Patellar tendinitis, left knee: Secondary | ICD-10-CM | POA: Diagnosis not present

## 2020-07-08 DIAGNOSIS — M25562 Pain in left knee: Secondary | ICD-10-CM | POA: Diagnosis not present

## 2020-07-21 DIAGNOSIS — I10 Essential (primary) hypertension: Secondary | ICD-10-CM | POA: Diagnosis not present

## 2020-07-21 DIAGNOSIS — E786 Lipoprotein deficiency: Secondary | ICD-10-CM | POA: Diagnosis not present

## 2020-07-21 DIAGNOSIS — E1165 Type 2 diabetes mellitus with hyperglycemia: Secondary | ICD-10-CM | POA: Diagnosis not present

## 2020-07-21 DIAGNOSIS — K219 Gastro-esophageal reflux disease without esophagitis: Secondary | ICD-10-CM | POA: Diagnosis not present

## 2020-08-06 ENCOUNTER — Other Ambulatory Visit: Payer: Self-pay | Admitting: Internal Medicine

## 2020-09-01 ENCOUNTER — Other Ambulatory Visit: Payer: Self-pay | Admitting: Internal Medicine

## 2020-09-27 ENCOUNTER — Ambulatory Visit: Payer: 59 | Admitting: Obstetrics & Gynecology

## 2020-10-04 ENCOUNTER — Other Ambulatory Visit: Payer: Self-pay | Admitting: Internal Medicine

## 2020-11-03 ENCOUNTER — Other Ambulatory Visit: Payer: Self-pay | Admitting: Internal Medicine

## 2020-11-16 DIAGNOSIS — H8110 Benign paroxysmal vertigo, unspecified ear: Secondary | ICD-10-CM | POA: Diagnosis not present

## 2020-11-16 DIAGNOSIS — E786 Lipoprotein deficiency: Secondary | ICD-10-CM | POA: Diagnosis not present

## 2020-11-16 DIAGNOSIS — E1165 Type 2 diabetes mellitus with hyperglycemia: Secondary | ICD-10-CM | POA: Diagnosis not present

## 2020-11-16 DIAGNOSIS — I10 Essential (primary) hypertension: Secondary | ICD-10-CM | POA: Diagnosis not present

## 2020-12-02 ENCOUNTER — Other Ambulatory Visit: Payer: Self-pay | Admitting: Internal Medicine

## 2020-12-14 DIAGNOSIS — H40053 Ocular hypertension, bilateral: Secondary | ICD-10-CM | POA: Diagnosis not present

## 2021-01-03 ENCOUNTER — Other Ambulatory Visit: Payer: Self-pay | Admitting: Internal Medicine

## 2021-01-10 ENCOUNTER — Other Ambulatory Visit: Payer: Self-pay | Admitting: Internal Medicine

## 2021-01-10 DIAGNOSIS — Z1231 Encounter for screening mammogram for malignant neoplasm of breast: Secondary | ICD-10-CM

## 2021-01-25 ENCOUNTER — Encounter: Payer: Self-pay | Admitting: Obstetrics & Gynecology

## 2021-01-25 ENCOUNTER — Ambulatory Visit (INDEPENDENT_AMBULATORY_CARE_PROVIDER_SITE_OTHER): Payer: 59 | Admitting: Obstetrics & Gynecology

## 2021-01-25 ENCOUNTER — Other Ambulatory Visit: Payer: Self-pay

## 2021-01-25 VITALS — BP 110/60 | Ht 63.0 in | Wt 146.0 lb

## 2021-01-25 DIAGNOSIS — Z01419 Encounter for gynecological examination (general) (routine) without abnormal findings: Secondary | ICD-10-CM | POA: Diagnosis not present

## 2021-01-25 DIAGNOSIS — Z9889 Other specified postprocedural states: Secondary | ICD-10-CM | POA: Diagnosis not present

## 2021-01-25 NOTE — Progress Notes (Signed)
HPI:      [name] is a 45 y.o. [id number] who LMP was Patient's last menstrual period was 01/15/2021., she presents today for her annual examination. The patient has no complaints today.  Monthly light 2-3 day cycles, much better than prior to ablation.  Occas hot flashes.  The patient is sexually active. Her last pap: approximate date 2020 and was normal and last mammogram: approximate date 2021 and was normal. The patient does perform self breast exams.  There is no notable family history of breast or ovarian cancer in her family.  The patient has regular exercise: yes.  The patient denies current symptoms of depression.    GYN History: Contraception: vasectomy  PMHx: Past Medical History:  Diagnosis Date  . Allergic rhinitis   . Chickenpox   . Claustrophobia   . Diabetes ([place])   . GERD (gastroesophageal reflux disease)   . Hyperlipidemia associated with type 2 diabetes mellitus ([place])   . Hypertension    Past Surgical History:  Procedure Laterality Date  . DILITATION & CURRETTAGE/HYSTROSCOPY WITH NOVASURE ABLATION N/A 09/11/2019   Procedure: DILATATION & CURETTAGE/HYSTEROSCOPY W ABLATION;  Surgeon: Gae Dry, MD;  Location: [facility] ORS;  Service: Gynecology;  Laterality: N/A;  . FOOT SURGERY  2010   nodule removed from right foot  . LUMBAR DISC SURGERY  2008   discectomy ([facility])   Family History  Problem Relation Age of Onset  . Alcoholism Other   . Arthritis Other   . Uterine cancer Other   . Hyperlipidemia Other   . Heart disease Other   . Kidney disease Other   . Hypertension Other   . Diabetes Other   . Breast cancer Other   . Diabetes Mother   . Hypertension Mother   . Glaucoma Mother   . Squamous cell carcinoma Mother   . Heart disease Father        CHF  . Alcoholism Father   . Renal Disease Father   . Hypertension Sister   . Arthritis/Rheumatoid Sister   . Hypertension Brother   . Asthma Son   . Hypertension Sister   . Diabetes Sister   .  Hypertension Brother   . Arthritis/Rheumatoid Brother   . Hypertension Brother    Social History   Tobacco Use  . Smoking status: Never Smoker  . Smokeless tobacco: Never Used  Vaping Use  . Vaping Use: Never used  Substance Use Topics  . Alcohol use: Yes    Alcohol/week: 1.0 standard drink    Types: 1 Standard drinks or equivalent per week  . Drug use: No    Current Outpatient Medications:  .  Blood Glucose Monitoring Suppl (FIFTY50 GLUCOSE METER 2.0) w/Device KIT, Apply 1 Device topically 2 (two) times daily., Disp: , Rfl:  .  glipiZIDE (GLUCOTROL) 10 MG tablet, Take 1 tablet (10 mg total) by mouth 2 (two) times daily before a meal., Disp: 60 tablet, Rfl: 3 .  glucose blood (FREESTYLE LITE) test strip, Apply 1 Device topically 2 (two) times daily., Disp: , Rfl:  .  Insulin Pen Needle 31G X 8 MM MISC, USE AS DIRECTED, Disp: , Rfl:  .  loratadine (CLARITIN) 10 MG tablet, Take 10 mg by mouth every evening., Disp: , Rfl:  .  losartan (COZAAR) 25 MG tablet, Take 25 mg by mouth every evening. , Disp: , Rfl:  .  metFORMIN (GLUCOPHAGE) 1000 MG tablet, Take 1,000 mg by mouth 2 (two) times daily. , Disp: ,  Rfl:  .  OZEMPIC 0.25 or 0.5 MG/DOSE SOPN, Inject 0.5 mg into the skin every Friday. , Disp: , Rfl: 5 .  amoxicillin (AMOXIL) 875 MG tablet, Take 1 tablet (875 mg total) by mouth 2 (two) times daily. (Patient not taking: Reported on 01/25/2021), Disp: 20 tablet, Rfl: 0 .  atorvastatin (LIPITOR) 10 MG tablet, Take 10 mg by mouth every evening. , Disp: , Rfl:  .  dextromethorphan (DELSYM) 30 MG/5ML liquid, Take 60 mg by mouth as needed for cough. (Patient not taking: Reported on 01/25/2021), Disp: , Rfl:  .  esomeprazole (NEXIUM) 40 MG capsule, Take 40 mg by mouth 2 (two) times daily. , Disp: , Rfl:  Allergies: Canagliflozin  Review of Systems  Constitutional: Negative for chills, fever and malaise/fatigue.  HENT: Negative for congestion, sinus pain and sore throat.   Eyes: Negative for  blurred vision and pain.  Respiratory: Negative for cough and wheezing.   Cardiovascular: Negative for chest pain and leg swelling.  Gastrointestinal: Negative for abdominal pain, constipation, diarrhea, heartburn, nausea and vomiting.  Genitourinary: Negative for dysuria, frequency, hematuria and urgency.  Musculoskeletal: Negative for back pain, joint pain, myalgias and neck pain.  Skin: Negative for itching and rash.  Neurological: Negative for dizziness, tremors and weakness.  Endo/Heme/Allergies: Does not bruise/bleed easily.  Psychiatric/Behavioral: Negative for depression. The patient is not nervous/anxious and does not have insomnia.     Objective: BP 110/60   Ht 5' 3"  (1.6 m)   Wt 146 lb (66.2 kg)   LMP 01/15/2021   BMI 25.86 kg/m   Filed Weights   01/25/21 1527  Weight: 146 lb (66.2 kg)   Body mass index is 25.86 kg/m. Physical Exam Constitutional:      General: She is not in acute distress.    Appearance: She is well-developed.  Genitourinary:     Rectum normal.     No lesions in the vagina.     No vaginal bleeding.      Right Adnexa: not tender and no mass present.    Left Adnexa: not tender and no mass present.    No cervical motion tenderness, friability, lesion or polyp.     Uterus is not enlarged.     No uterine mass detected. Breasts:     Right: No mass, skin change or tenderness.     Left: No mass, skin change or tenderness.    HENT:     Head: Normocephalic and atraumatic. No laceration.     Right Ear: Hearing normal.     Left Ear: Hearing normal.     Mouth/Throat:     Pharynx: Uvula midline.  Eyes:     Pupils: Pupils are equal, round, and reactive to light.  Neck:     Thyroid: No thyromegaly.  Cardiovascular:     Rate and Rhythm: Normal rate and regular rhythm.     Heart sounds: No murmur heard. No friction rub. No gallop.   Pulmonary:     Effort: Pulmonary effort is normal. No respiratory distress.     Breath sounds: Normal breath sounds.  No wheezing.  Abdominal:     General: Bowel sounds are normal. There is no distension.     Palpations: Abdomen is soft.     Tenderness: There is no abdominal tenderness. There is no rebound.  Musculoskeletal:        General: Normal range of motion.     Cervical back: Normal range of motion and neck supple.  Neurological:  Mental Status: She is alert and oriented to person, place, and time.     Cranial Nerves: No cranial nerve deficit.  Skin:    General: Skin is warm and dry.  Psychiatric:        Judgment: Judgment normal.  Vitals reviewed.     Assessment:  ANNUAL EXAM 1. Women's annual routine gynecological examination   2. S/P endometrial ablation     Screening Plan:            1.  Cervical Screening-  Pap smear schedule reviewed with patient  2. Breast screening- Exam annually and mammogram>40 planned (scheduled 02/02/21)  3. Labs managed by PCP  4. Counseling for contraception: vasectomy   5. S/P endometrial ablation Doing well   6.  Monitor for worsening sx's of menopause      F/U  Return in about 1 year (around 01/25/2022) for Annual.  Barnett Applebaum, MD, [facility], [facility] 01/25/2021  3:32 PM

## 2021-01-25 NOTE — Patient Instructions (Signed)
Thank you for choosing [facility]. As part of our ongoing efforts to improve patient experience, we would appreciate your feedback. Please fill out the short survey that you will receive by mail or MyChart. Your opinion is important to [place]! -Dr Kenton Kingfisher

## 2021-01-31 ENCOUNTER — Other Ambulatory Visit: Payer: Self-pay | Admitting: Internal Medicine

## 2021-02-02 ENCOUNTER — Inpatient Hospital Stay: Admission: RE | Admit: 2021-02-02 | Payer: 59 | Source: Ambulatory Visit

## 2021-02-07 ENCOUNTER — Other Ambulatory Visit: Payer: Self-pay

## 2021-02-07 MED ORDER — ONDANSETRON HCL 4 MG PO TABS
ORAL_TABLET | ORAL | 1 refills | Status: DC
  Filled 2021-02-07: qty 30, 15d supply, fill #0
  Filled 2021-11-21: qty 30, 15d supply, fill #1

## 2021-02-07 MED ORDER — DOXYCYCLINE MONOHYDRATE 100 MG PO CAPS
ORAL_CAPSULE | ORAL | 0 refills | Status: AC
  Filled 2021-02-07: qty 20, 10d supply, fill #0

## 2021-02-22 ENCOUNTER — Other Ambulatory Visit: Payer: Self-pay

## 2021-02-22 ENCOUNTER — Ambulatory Visit
Admission: RE | Admit: 2021-02-22 | Discharge: 2021-02-22 | Disposition: A | Discharge status: Home / Self Care | Payer: 59 | Source: Ambulatory Visit | Attending: Internal Medicine | Admitting: Internal Medicine

## 2021-02-22 DIAGNOSIS — Z1231 Encounter for screening mammogram for malignant neoplasm of breast: Secondary | ICD-10-CM | POA: Diagnosis not present

## 2021-03-03 ENCOUNTER — Other Ambulatory Visit: Payer: Self-pay

## 2021-03-03 MED FILL — Losartan Potassium Tab 25 MG: ORAL | 60 days supply | Qty: 90 | Fill #0 | Status: CN

## 2021-03-04 ENCOUNTER — Other Ambulatory Visit: Payer: Self-pay

## 2021-03-04 MED ORDER — SEMAGLUTIDE(0.25 OR 0.5MG/DOS) 2 MG/1.5ML ~~LOC~~ SOPN
PEN_INJECTOR | SUBCUTANEOUS | 5 refills | Status: AC
  Filled 2021-03-04: qty 1.5, 56d supply, fill #0
  Filled 2021-04-26: qty 1.5, 56d supply, fill #1
  Filled 2021-05-13: qty 1.5, 56d supply, fill #2

## 2021-03-30 ENCOUNTER — Other Ambulatory Visit: Payer: Self-pay

## 2021-03-30 MED ORDER — FLUCONAZOLE 150 MG PO TABS
150.0000 mg | ORAL_TABLET | Freq: Once | ORAL | 3 refills | Status: AC
  Filled 2021-03-30: qty 1, 1d supply, fill #0
  Filled 2021-04-26: qty 2, 2d supply, fill #1
  Filled 2021-05-17: qty 1, 1d supply, fill #2

## 2021-03-30 MED FILL — Losartan Potassium Tab 25 MG: ORAL | 60 days supply | Qty: 60 | Fill #0 | Status: AC

## 2021-03-30 MED FILL — Metoprolol Succinate Tab ER 24HR 25 MG (Tartrate Equiv): ORAL | 90 days supply | Qty: 90 | Fill #0 | Status: AC

## 2021-04-22 DIAGNOSIS — I10 Essential (primary) hypertension: Secondary | ICD-10-CM | POA: Diagnosis not present

## 2021-04-22 DIAGNOSIS — E786 Lipoprotein deficiency: Secondary | ICD-10-CM | POA: Diagnosis not present

## 2021-04-22 DIAGNOSIS — E1165 Type 2 diabetes mellitus with hyperglycemia: Secondary | ICD-10-CM | POA: Diagnosis not present

## 2021-04-22 DIAGNOSIS — Z1329 Encounter for screening for other suspected endocrine disorder: Secondary | ICD-10-CM | POA: Diagnosis not present

## 2021-04-26 ENCOUNTER — Other Ambulatory Visit: Payer: Self-pay

## 2021-04-26 DIAGNOSIS — I10 Essential (primary) hypertension: Secondary | ICD-10-CM | POA: Diagnosis not present

## 2021-04-26 DIAGNOSIS — E119 Type 2 diabetes mellitus without complications: Secondary | ICD-10-CM | POA: Diagnosis not present

## 2021-04-26 DIAGNOSIS — E1165 Type 2 diabetes mellitus with hyperglycemia: Secondary | ICD-10-CM | POA: Diagnosis not present

## 2021-04-26 DIAGNOSIS — Z1331 Encounter for screening for depression: Secondary | ICD-10-CM | POA: Diagnosis not present

## 2021-04-26 DIAGNOSIS — Z0000 Encounter for general adult medical examination without abnormal findings: Secondary | ICD-10-CM | POA: Diagnosis not present

## 2021-04-26 DIAGNOSIS — Z23 Encounter for immunization: Secondary | ICD-10-CM | POA: Diagnosis not present

## 2021-04-26 MED ORDER — METFORMIN HCL 1000 MG PO TABS
ORAL_TABLET | ORAL | 1 refills | Status: DC
  Filled 2021-04-26: qty 180, 90d supply, fill #0
  Filled 2021-07-28: qty 180, 90d supply, fill #1

## 2021-04-26 MED ORDER — ATORVASTATIN CALCIUM 10 MG PO TABS
ORAL_TABLET | ORAL | 1 refills | Status: AC
  Filled 2021-04-26: qty 90, 90d supply, fill #0
  Filled 2021-07-28: qty 90, 90d supply, fill #1

## 2021-04-26 MED ORDER — ESOMEPRAZOLE MAGNESIUM 40 MG PO CPDR
DELAYED_RELEASE_CAPSULE | ORAL | 1 refills | Status: DC
  Filled 2021-04-26: qty 180, 90d supply, fill #0
  Filled 2021-07-28: qty 180, 90d supply, fill #1

## 2021-04-26 MED ORDER — METOPROLOL SUCCINATE ER 25 MG PO TB24
ORAL_TABLET | ORAL | 1 refills | Status: DC
  Filled 2021-04-26 – 2021-07-06 (×2): qty 90, 90d supply, fill #0
  Filled 2021-09-26: qty 90, 90d supply, fill #1

## 2021-04-26 MED ORDER — ALPRAZOLAM 0.25 MG PO TABS
ORAL_TABLET | ORAL | 2 refills | Status: DC
  Filled 2021-04-26: qty 30, 30d supply, fill #0

## 2021-04-26 MED ORDER — LOSARTAN POTASSIUM 25 MG PO TABS
25.0000 mg | ORAL_TABLET | Freq: Every day | ORAL | 1 refills | Status: DC
  Filled 2021-04-26 – 2021-05-30 (×2): qty 90, 90d supply, fill #0
  Filled 2021-08-26: qty 90, 90d supply, fill #1

## 2021-04-26 MED ORDER — GLIPIZIDE 10 MG PO TABS
ORAL_TABLET | ORAL | 1 refills | Status: DC
  Filled 2021-04-26: qty 180, 90d supply, fill #0
  Filled 2021-07-28: qty 180, 90d supply, fill #1

## 2021-04-26 MED ORDER — FREESTYLE LITE TEST VI STRP
ORAL_STRIP | 1 refills | Status: DC
  Filled 2021-04-26: qty 200, 90d supply, fill #0
  Filled 2022-01-09: qty 200, 90d supply, fill #1

## 2021-04-26 MED ORDER — OZEMPIC (0.25 OR 0.5 MG/DOSE) 2 MG/1.5ML ~~LOC~~ SOPN
PEN_INJECTOR | SUBCUTANEOUS | 1 refills | Status: AC
  Filled 2021-04-26 – 2021-05-30 (×2): qty 4.5, 84d supply, fill #0

## 2021-04-27 ENCOUNTER — Other Ambulatory Visit: Payer: Self-pay

## 2021-05-12 LAB — COLOGUARD

## 2021-05-13 ENCOUNTER — Other Ambulatory Visit: Payer: Self-pay

## 2021-05-16 ENCOUNTER — Other Ambulatory Visit: Payer: Self-pay

## 2021-05-17 ENCOUNTER — Other Ambulatory Visit: Payer: Self-pay

## 2021-05-18 ENCOUNTER — Other Ambulatory Visit: Payer: Self-pay

## 2021-05-23 DIAGNOSIS — Z1211 Encounter for screening for malignant neoplasm of colon: Secondary | ICD-10-CM | POA: Diagnosis not present

## 2021-05-26 LAB — COLOGUARD: COLOGUARD: NEGATIVE

## 2021-05-30 ENCOUNTER — Other Ambulatory Visit: Payer: Self-pay

## 2021-05-31 ENCOUNTER — Other Ambulatory Visit: Payer: Self-pay

## 2021-07-06 ENCOUNTER — Other Ambulatory Visit: Payer: Self-pay

## 2021-07-07 ENCOUNTER — Other Ambulatory Visit: Payer: Self-pay

## 2021-07-07 ENCOUNTER — Ambulatory Visit: Payer: 59 | Admitting: Dermatology

## 2021-07-07 DIAGNOSIS — D1800 Hemangioma unspecified site: Secondary | ICD-10-CM | POA: Diagnosis not present

## 2021-07-07 DIAGNOSIS — D485 Neoplasm of uncertain behavior of skin: Secondary | ICD-10-CM | POA: Diagnosis not present

## 2021-07-07 DIAGNOSIS — L821 Other seborrheic keratosis: Secondary | ICD-10-CM | POA: Diagnosis not present

## 2021-07-07 DIAGNOSIS — D2262 Melanocytic nevi of left upper limb, including shoulder: Secondary | ICD-10-CM

## 2021-07-07 DIAGNOSIS — D229 Melanocytic nevi, unspecified: Secondary | ICD-10-CM

## 2021-07-07 DIAGNOSIS — Z808 Family history of malignant neoplasm of other organs or systems: Secondary | ICD-10-CM

## 2021-07-07 DIAGNOSIS — L814 Other melanin hyperpigmentation: Secondary | ICD-10-CM

## 2021-07-07 DIAGNOSIS — D2239 Melanocytic nevi of other parts of face: Secondary | ICD-10-CM

## 2021-07-07 DIAGNOSIS — Z1283 Encounter for screening for malignant neoplasm of skin: Secondary | ICD-10-CM

## 2021-07-07 DIAGNOSIS — D3611 Benign neoplasm of peripheral nerves and autonomic nervous system of face, head, and neck: Secondary | ICD-10-CM | POA: Diagnosis not present

## 2021-07-07 DIAGNOSIS — L578 Other skin changes due to chronic exposure to nonionizing radiation: Secondary | ICD-10-CM

## 2021-07-07 NOTE — Progress Notes (Signed)
New Patient Visit  Subjective  Tonya Sampson is a 45 y.o. female who presents for the following: Annual Exam (Fhx of SCC in mother - patient did use a tanning bed frequently in the past.).  She has an irritated spot at her neck she would like removed if possible.  The following portions of the chart were reviewed this encounter and updated as appropriate:   Tobacco  Allergies  Meds  Problems  Med Hx  Surg Hx  Fam Hx      Review of Systems:  No other skin or systemic complaints except as noted in HPI or Assessment and Plan.  Objective  Well appearing patient in no apparent distress; mood and affect are within normal limits.  A full examination was performed including scalp, head, eyes, ears, nose, lips, neck, chest, axillae, abdomen, back, buttocks, bilateral upper extremities, bilateral lower extremities, hands, feet, fingers, toes, fingernails, and toenails. All findings within normal limits unless otherwise noted below.  L upper arm 0.2 cm dark brown thin papule.  R alar crease 0.3 cm skin colored smooth papule       L neck 0.4 cm erythematous papule       Assessment & Plan  Nevus L upper arm  Benign-appearing.  Observation.  Call clinic for new or changing moles.  Recommend daily use of broad spectrum spf 30+ sunscreen to sun-exposed areas.   Neoplasm of uncertain behavior of skin (2) R alar crease  Skin / nail biopsy Type of biopsy: tangential   Informed consent: discussed and consent obtained   Timeout: patient name, date of birth, surgical site, and procedure verified   Procedure prep:  Patient was prepped and draped in usual sterile fashion Prep type:  Isopropyl alcohol Anesthesia: the lesion was anesthetized in a standard fashion   Anesthetic:  1% lidocaine w/ epinephrine 1-100,000 buffered w/ 8.4% NaHCO3 Instrument used: flexible razor blade   Hemostasis achieved with: pressure, aluminum chloride and electrodesiccation   Outcome: patient  tolerated procedure well   Post-procedure details: sterile dressing applied and wound care instructions given   Dressing type: bandage and petrolatum    Specimen 1 - Surgical pathology Differential Diagnosis: D48.5 r/o angiofibroma vs BCC  Check Margins: No  L neck  Epidermal / dermal shaving  Lesion diameter (cm):  0.4 Informed consent: discussed and consent obtained   Timeout: patient name, date of birth, surgical site, and procedure verified   Anesthesia: the lesion was anesthetized in a standard fashion   Anesthetic:  1% lidocaine w/ epinephrine 1-100,000 local infiltration Instrument used: flexible razor blade   Hemostasis achieved with: aluminum chloride   Outcome: patient tolerated procedure well   Post-procedure details: wound care instructions given   Additional details:  Mupirocin and a bandage applied  Specimen 2 - Surgical pathology Differential Diagnosis: D48.5 r/o irritated nevus vs other  Check Margins: No  Lentigines - Scattered tan macules - Due to sun exposure - Benign-appering, observe - Recommend daily broad spectrum sunscreen SPF 30+ to sun-exposed areas, reapply every 2 hours as needed. - Call for any changes  Seborrheic Keratoses - Stuck-on, waxy, tan-brown papules and/or plaques  - Benign-appearing - Discussed benign etiology and prognosis. - Observe - Call for any changes  Melanocytic Nevi - Tan-brown and/or pink-flesh-colored symmetric macules and papules - Benign appearing on exam today - Observation - Call clinic for new or changing moles - Recommend daily use of broad spectrum spf 30+ sunscreen to sun-exposed areas.   Hemangiomas - Red  papules - Discussed benign nature - Observe - Call for any changes  Actinic Damage - Chronic condition, secondary to cumulative UV/sun exposure - diffuse scaly erythematous macules with underlying dyspigmentation - Recommend daily broad spectrum sunscreen SPF 30+ to sun-exposed areas, reapply every 2  hours as needed.  - Staying in the shade or wearing long sleeves, sun glasses (UVA+UVB protection) and wide brim hats (4-inch brim around the entire circumference of the hat) are also recommended for sun protection.  - Call for new or changing lesions.  Skin cancer screening performed today.  Return in about 1 year (around 07/07/2022) for TBSE.  Luther Redo, CMA, am acting as scribe for Forest Gleason, MD .  Documentation: I have reviewed the above documentation for accuracy and completeness, and I agree with the above.  Forest Gleason, MD

## 2021-07-07 NOTE — Patient Instructions (Addendum)
If you have any questions or concerns for your doctor, please call our main line at [phone] and press option 4 to reach your doctor's medical assistant. If no one answers, please leave a voicemail as directed and we will return your call as soon as possible. Messages left after 4 pm will be answered the following business day.   You may also send [place] a message via [place]. We typically respond to MyChart messages within 1-2 business days.  For prescription refills, please ask your pharmacy to contact our office. Our fax number is [phone].  If you have an urgent issue when the clinic is closed that cannot wait until the next business day, you can page your doctor at the number below.    Please note that while we do our best to be available for urgent issues outside of office hours, we are not available 24/7.   If you have an urgent issue and are unable to reach [place], you may choose to seek medical care at your doctor's office, retail clinic, urgent care center, or emergency room.  If you have a medical emergency, please immediately call 911 or go to the emergency department.  Pager Numbers  - Dr. Nehemiah Massed: [phone]  - Dr. Laurence Ferrari: [phone]  - Dr. Nicole Kindred: [phone]  In the event of inclement weather, please call our main line at [phone] for an update on the status of any delays or closures.  Dermatology Medication Tips: Please keep the boxes that topical medications come in in order to help keep track of the instructions about where and how to use these. Pharmacies typically print the medication instructions only on the boxes and not directly on the medication tubes.   If your medication is too expensive, please contact our office at [phone] option 4 or send [place] a message through [place].   We are unable to tell what your co-pay for medications will be in advance as this is different depending on your insurance coverage. However, we may be able to find a substitute  medication at lower cost or fill out paperwork to get insurance to cover a needed medication.   If a prior authorization is required to get your medication covered by your insurance company, please allow [place] 1-2 business days to complete this process.  Drug prices often vary depending on where the prescription is filled and some pharmacies may offer cheaper prices.  The website www.goodrx.com contains coupons for medications through different pharmacies. The prices here do not account for what the cost may be with help from insurance (it may be cheaper with your insurance), but the website can give you the price if you did not use any insurance.  - You can print the associated coupon and take it with your prescription to the pharmacy.  - You may also stop by our office during regular business hours and pick up a GoodRx coupon card.  - If you need your prescription sent electronically to a different pharmacy, notify our office through [facility] or by phone at [phone] option 4.  Recommend taking Heliocare sun protection supplement daily in sunny weather for additional sun protection. For maximum protection on the sunniest days, you can take up to 2 capsules of regular Heliocare OR take 1 capsule of Heliocare Ultra. For prolonged exposure (such as a full day in the sun), you can repeat your dose of the supplement 4 hours after your first dose. Heliocare can be purchased at [facility] or at [email or web address].

## 2021-07-20 ENCOUNTER — Encounter: Payer: Self-pay | Admitting: Dermatology

## 2021-07-28 ENCOUNTER — Other Ambulatory Visit: Payer: Self-pay

## 2021-07-29 ENCOUNTER — Other Ambulatory Visit: Payer: Self-pay

## 2021-08-08 DIAGNOSIS — M9901 Segmental and somatic dysfunction of cervical region: Secondary | ICD-10-CM | POA: Diagnosis not present

## 2021-08-08 DIAGNOSIS — M542 Cervicalgia: Secondary | ICD-10-CM | POA: Diagnosis not present

## 2021-08-08 DIAGNOSIS — M4602 Spinal enthesopathy, cervical region: Secondary | ICD-10-CM | POA: Diagnosis not present

## 2021-08-08 DIAGNOSIS — M9907 Segmental and somatic dysfunction of upper extremity: Secondary | ICD-10-CM | POA: Diagnosis not present

## 2021-08-08 DIAGNOSIS — M7541 Impingement syndrome of right shoulder: Secondary | ICD-10-CM | POA: Diagnosis not present

## 2021-08-10 NOTE — Telephone Encounter (Signed)
Mirena not rcvd. Patient scheduled w/[facility] for Hysteroscopy, D&C, Polypectomy & Ablation.

## 2021-08-11 DIAGNOSIS — H40053 Ocular hypertension, bilateral: Secondary | ICD-10-CM | POA: Diagnosis not present

## 2021-08-11 DIAGNOSIS — M7541 Impingement syndrome of right shoulder: Secondary | ICD-10-CM | POA: Diagnosis not present

## 2021-08-11 DIAGNOSIS — M542 Cervicalgia: Secondary | ICD-10-CM | POA: Diagnosis not present

## 2021-08-11 DIAGNOSIS — M4602 Spinal enthesopathy, cervical region: Secondary | ICD-10-CM | POA: Diagnosis not present

## 2021-08-11 DIAGNOSIS — M9907 Segmental and somatic dysfunction of upper extremity: Secondary | ICD-10-CM | POA: Diagnosis not present

## 2021-08-11 DIAGNOSIS — E113293 Type 2 diabetes mellitus with mild nonproliferative diabetic retinopathy without macular edema, bilateral: Secondary | ICD-10-CM | POA: Diagnosis not present

## 2021-08-11 DIAGNOSIS — M9901 Segmental and somatic dysfunction of cervical region: Secondary | ICD-10-CM | POA: Diagnosis not present

## 2021-08-15 DIAGNOSIS — M7541 Impingement syndrome of right shoulder: Secondary | ICD-10-CM | POA: Diagnosis not present

## 2021-08-15 DIAGNOSIS — M4602 Spinal enthesopathy, cervical region: Secondary | ICD-10-CM | POA: Diagnosis not present

## 2021-08-15 DIAGNOSIS — M542 Cervicalgia: Secondary | ICD-10-CM | POA: Diagnosis not present

## 2021-08-15 DIAGNOSIS — M9901 Segmental and somatic dysfunction of cervical region: Secondary | ICD-10-CM | POA: Diagnosis not present

## 2021-08-15 DIAGNOSIS — M9907 Segmental and somatic dysfunction of upper extremity: Secondary | ICD-10-CM | POA: Diagnosis not present

## 2021-08-17 DIAGNOSIS — M9901 Segmental and somatic dysfunction of cervical region: Secondary | ICD-10-CM | POA: Diagnosis not present

## 2021-08-17 DIAGNOSIS — M4602 Spinal enthesopathy, cervical region: Secondary | ICD-10-CM | POA: Diagnosis not present

## 2021-08-17 DIAGNOSIS — M9907 Segmental and somatic dysfunction of upper extremity: Secondary | ICD-10-CM | POA: Diagnosis not present

## 2021-08-17 DIAGNOSIS — M542 Cervicalgia: Secondary | ICD-10-CM | POA: Diagnosis not present

## 2021-08-17 DIAGNOSIS — M7541 Impingement syndrome of right shoulder: Secondary | ICD-10-CM | POA: Diagnosis not present

## 2021-08-22 DIAGNOSIS — E1165 Type 2 diabetes mellitus with hyperglycemia: Secondary | ICD-10-CM | POA: Diagnosis not present

## 2021-08-22 DIAGNOSIS — I10 Essential (primary) hypertension: Secondary | ICD-10-CM | POA: Diagnosis not present

## 2021-08-22 DIAGNOSIS — E119 Type 2 diabetes mellitus without complications: Secondary | ICD-10-CM | POA: Diagnosis not present

## 2021-08-22 DIAGNOSIS — E786 Lipoprotein deficiency: Secondary | ICD-10-CM | POA: Diagnosis not present

## 2021-08-22 DIAGNOSIS — Z0000 Encounter for general adult medical examination without abnormal findings: Secondary | ICD-10-CM | POA: Diagnosis not present

## 2021-08-22 DIAGNOSIS — K219 Gastro-esophageal reflux disease without esophagitis: Secondary | ICD-10-CM | POA: Diagnosis not present

## 2021-08-22 DIAGNOSIS — Z794 Long term (current) use of insulin: Secondary | ICD-10-CM | POA: Diagnosis not present

## 2021-08-24 ENCOUNTER — Other Ambulatory Visit: Payer: Self-pay

## 2021-08-24 DIAGNOSIS — E1165 Type 2 diabetes mellitus with hyperglycemia: Secondary | ICD-10-CM | POA: Diagnosis not present

## 2021-08-24 DIAGNOSIS — I10 Essential (primary) hypertension: Secondary | ICD-10-CM | POA: Diagnosis not present

## 2021-08-24 DIAGNOSIS — K219 Gastro-esophageal reflux disease without esophagitis: Secondary | ICD-10-CM | POA: Diagnosis not present

## 2021-08-24 DIAGNOSIS — M25511 Pain in right shoulder: Secondary | ICD-10-CM | POA: Diagnosis not present

## 2021-08-24 MED ORDER — MELOXICAM 15 MG PO TABS
ORAL_TABLET | ORAL | 3 refills | Status: AC
  Filled 2021-08-24: qty 30, 30d supply, fill #0
  Filled 2022-05-25: qty 30, 30d supply, fill #1

## 2021-08-24 MED ORDER — OZEMPIC (1 MG/DOSE) 4 MG/3ML ~~LOC~~ SOPN
PEN_INJECTOR | SUBCUTANEOUS | 5 refills | Status: DC
  Filled 2021-08-24: qty 3, 28d supply, fill #0
  Filled 2021-09-26: qty 3, 28d supply, fill #1
  Filled 2021-10-25: qty 3, 28d supply, fill #2
  Filled 2021-10-27: qty 9, 84d supply, fill #2

## 2021-08-26 ENCOUNTER — Other Ambulatory Visit: Payer: Self-pay

## 2021-08-26 MED ORDER — ESOMEPRAZOLE MAGNESIUM 40 MG PO CPDR
DELAYED_RELEASE_CAPSULE | ORAL | 1 refills | Status: DC
  Filled 2021-08-26 – 2022-01-09 (×2): qty 180, 90d supply, fill #0
  Filled 2022-04-21: qty 180, 90d supply, fill #1

## 2021-09-23 ENCOUNTER — Other Ambulatory Visit: Payer: Self-pay

## 2021-09-26 ENCOUNTER — Other Ambulatory Visit: Payer: Self-pay

## 2021-09-27 ENCOUNTER — Other Ambulatory Visit: Payer: Self-pay

## 2021-10-25 ENCOUNTER — Other Ambulatory Visit: Payer: Self-pay

## 2021-10-27 ENCOUNTER — Other Ambulatory Visit: Payer: Self-pay

## 2021-10-27 MED ORDER — ATORVASTATIN CALCIUM 10 MG PO TABS
ORAL_TABLET | Freq: Every day | ORAL | 1 refills | Status: DC
  Filled 2021-10-27: qty 90, 90d supply, fill #0
  Filled 2022-02-16: qty 90, 90d supply, fill #1

## 2021-11-03 ENCOUNTER — Other Ambulatory Visit: Payer: Self-pay

## 2021-11-03 MED ORDER — CARESTART COVID-19 HOME TEST VI KIT
PACK | 0 refills | Status: AC
  Filled 2021-11-03: qty 2, 4d supply, fill #0

## 2021-11-21 ENCOUNTER — Other Ambulatory Visit: Payer: Self-pay

## 2021-12-15 ENCOUNTER — Other Ambulatory Visit: Payer: Self-pay

## 2021-12-15 MED ORDER — LOSARTAN POTASSIUM 25 MG PO TABS
25.0000 mg | ORAL_TABLET | Freq: Every day | ORAL | 1 refills | Status: DC
  Filled 2021-12-15: qty 90, 90d supply, fill #0
  Filled 2022-03-23: qty 90, 90d supply, fill #1

## 2021-12-26 DIAGNOSIS — K219 Gastro-esophageal reflux disease without esophagitis: Secondary | ICD-10-CM | POA: Diagnosis not present

## 2021-12-26 DIAGNOSIS — E1165 Type 2 diabetes mellitus with hyperglycemia: Secondary | ICD-10-CM | POA: Diagnosis not present

## 2021-12-26 DIAGNOSIS — I10 Essential (primary) hypertension: Secondary | ICD-10-CM | POA: Diagnosis not present

## 2021-12-26 DIAGNOSIS — M25511 Pain in right shoulder: Secondary | ICD-10-CM | POA: Diagnosis not present

## 2021-12-27 ENCOUNTER — Other Ambulatory Visit: Payer: Self-pay

## 2021-12-27 DIAGNOSIS — I10 Essential (primary) hypertension: Secondary | ICD-10-CM | POA: Diagnosis not present

## 2021-12-27 DIAGNOSIS — B3789 Other sites of candidiasis: Secondary | ICD-10-CM | POA: Diagnosis not present

## 2021-12-27 DIAGNOSIS — E1165 Type 2 diabetes mellitus with hyperglycemia: Secondary | ICD-10-CM | POA: Diagnosis not present

## 2021-12-27 DIAGNOSIS — R002 Palpitations: Secondary | ICD-10-CM | POA: Diagnosis not present

## 2021-12-27 MED ORDER — FLUCONAZOLE 100 MG PO TABS
ORAL_TABLET | ORAL | 0 refills | Status: DC
  Filled 2021-12-27: qty 10, 10d supply, fill #0

## 2021-12-27 MED ORDER — NYSTATIN 100000 UNIT/GM EX POWD
Freq: Two times a day (BID) | CUTANEOUS | 1 refills | Status: DC
  Filled 2021-12-27: qty 60, 30d supply, fill #0
  Filled 2022-03-14: qty 60, 30d supply, fill #1

## 2021-12-27 MED ORDER — JARDIANCE 10 MG PO TABS
10.0000 mg | ORAL_TABLET | Freq: Every day | ORAL | 1 refills | Status: DC
  Filled 2021-12-27: qty 90, 90d supply, fill #0
  Filled 2022-03-23: qty 90, 90d supply, fill #1

## 2022-01-04 ENCOUNTER — Other Ambulatory Visit: Payer: Self-pay

## 2022-01-04 DIAGNOSIS — E663 Overweight: Secondary | ICD-10-CM | POA: Diagnosis not present

## 2022-01-04 MED ORDER — PHENDIMETRAZINE TARTRATE 35 MG PO TABS
ORAL_TABLET | ORAL | 0 refills | Status: DC
  Filled 2022-01-04: qty 90, 30d supply, fill #0

## 2022-01-05 ENCOUNTER — Other Ambulatory Visit: Payer: Self-pay

## 2022-01-09 ENCOUNTER — Other Ambulatory Visit: Payer: Self-pay

## 2022-01-10 ENCOUNTER — Other Ambulatory Visit: Payer: Self-pay

## 2022-01-10 MED ORDER — GLIPIZIDE 10 MG PO TABS
ORAL_TABLET | ORAL | 1 refills | Status: DC
Start: 2022-01-10 — End: 2022-11-03
  Filled 2022-01-10: qty 180, 90d supply, fill #0
  Filled 2022-06-30: qty 180, 90d supply, fill #1

## 2022-01-10 MED ORDER — SEMAGLUTIDE (1 MG/DOSE) 4 MG/3ML ~~LOC~~ SOPN
PEN_INJECTOR | SUBCUTANEOUS | 5 refills | Status: DC
  Filled 2022-01-10: qty 9, 84d supply, fill #0
  Filled 2022-03-23: qty 9, 84d supply, fill #1

## 2022-01-10 MED ORDER — METOPROLOL SUCCINATE ER 25 MG PO TB24
ORAL_TABLET | ORAL | 1 refills | Status: DC
Start: 2022-01-10 — End: 2022-08-28
  Filled 2022-01-10: qty 90, 90d supply, fill #0
  Filled 2022-04-21: qty 90, 90d supply, fill #1

## 2022-01-10 MED ORDER — METFORMIN HCL 1000 MG PO TABS
ORAL_TABLET | ORAL | 1 refills | Status: DC
  Filled 2022-01-10: qty 180, 90d supply, fill #0
  Filled 2022-06-30: qty 180, 90d supply, fill #1

## 2022-02-02 DIAGNOSIS — E663 Overweight: Secondary | ICD-10-CM | POA: Diagnosis not present

## 2022-02-16 ENCOUNTER — Other Ambulatory Visit: Payer: Self-pay

## 2022-02-16 MED ORDER — ONDANSETRON HCL 4 MG PO TABS
ORAL_TABLET | ORAL | 1 refills | Status: DC
  Filled 2022-02-16: qty 30, 15d supply, fill #0
  Filled 2022-08-28: qty 30, 15d supply, fill #1

## 2022-02-16 MED ORDER — ALPRAZOLAM 0.25 MG PO TABS
ORAL_TABLET | ORAL | 2 refills | Status: DC
  Filled 2022-02-16: qty 30, 30d supply, fill #0

## 2022-02-17 ENCOUNTER — Other Ambulatory Visit: Payer: Self-pay

## 2022-02-24 ENCOUNTER — Other Ambulatory Visit: Payer: Self-pay

## 2022-02-24 MED ORDER — AMOXICILLIN 500 MG PO CAPS
ORAL_CAPSULE | ORAL | 0 refills | Status: DC
  Filled 2022-02-24: qty 28, 9d supply, fill #0

## 2022-03-09 ENCOUNTER — Other Ambulatory Visit: Payer: Self-pay

## 2022-03-09 DIAGNOSIS — E663 Overweight: Secondary | ICD-10-CM | POA: Diagnosis not present

## 2022-03-09 MED ORDER — IBUPROFEN 800 MG PO TABS
ORAL_TABLET | ORAL | 0 refills | Status: AC
  Filled 2022-03-09: qty 21, 5d supply, fill #0

## 2022-03-09 MED ORDER — AMOXICILLIN 500 MG PO CAPS
ORAL_CAPSULE | ORAL | 0 refills | Status: AC
  Filled 2022-03-09: qty 21, 7d supply, fill #0

## 2022-03-09 MED ORDER — PHENDIMETRAZINE TARTRATE 35 MG PO TABS
ORAL_TABLET | ORAL | 0 refills | Status: AC
  Filled 2022-03-09: qty 90, 30d supply, fill #0

## 2022-03-09 MED ORDER — ACETAMINOPHEN-CODEINE #3 300-30 MG PO TABS
ORAL_TABLET | ORAL | 0 refills | Status: AC
  Filled 2022-03-09: qty 16, 4d supply, fill #0

## 2022-03-10 ENCOUNTER — Other Ambulatory Visit: Payer: Self-pay

## 2022-03-13 ENCOUNTER — Other Ambulatory Visit: Payer: Self-pay

## 2022-03-14 ENCOUNTER — Other Ambulatory Visit: Payer: Self-pay

## 2022-03-15 ENCOUNTER — Other Ambulatory Visit: Payer: Self-pay

## 2022-03-23 ENCOUNTER — Other Ambulatory Visit: Payer: Self-pay

## 2022-04-14 DIAGNOSIS — Z008 Encounter for other general examination: Secondary | ICD-10-CM | POA: Diagnosis not present

## 2022-04-19 ENCOUNTER — Other Ambulatory Visit: Payer: Self-pay | Admitting: Internal Medicine

## 2022-04-19 DIAGNOSIS — Z1231 Encounter for screening mammogram for malignant neoplasm of breast: Secondary | ICD-10-CM

## 2022-04-21 ENCOUNTER — Other Ambulatory Visit: Payer: Self-pay

## 2022-05-01 DIAGNOSIS — B3789 Other sites of candidiasis: Secondary | ICD-10-CM | POA: Diagnosis not present

## 2022-05-01 DIAGNOSIS — E1165 Type 2 diabetes mellitus with hyperglycemia: Secondary | ICD-10-CM | POA: Diagnosis not present

## 2022-05-01 DIAGNOSIS — R002 Palpitations: Secondary | ICD-10-CM | POA: Diagnosis not present

## 2022-05-01 DIAGNOSIS — I10 Essential (primary) hypertension: Secondary | ICD-10-CM | POA: Diagnosis not present

## 2022-05-02 DIAGNOSIS — Z1331 Encounter for screening for depression: Secondary | ICD-10-CM | POA: Diagnosis not present

## 2022-05-02 DIAGNOSIS — R300 Dysuria: Secondary | ICD-10-CM | POA: Diagnosis not present

## 2022-05-02 DIAGNOSIS — I10 Essential (primary) hypertension: Secondary | ICD-10-CM | POA: Diagnosis not present

## 2022-05-02 DIAGNOSIS — E1165 Type 2 diabetes mellitus with hyperglycemia: Secondary | ICD-10-CM | POA: Diagnosis not present

## 2022-05-02 DIAGNOSIS — K219 Gastro-esophageal reflux disease without esophagitis: Secondary | ICD-10-CM | POA: Diagnosis not present

## 2022-05-02 DIAGNOSIS — M25511 Pain in right shoulder: Secondary | ICD-10-CM | POA: Diagnosis not present

## 2022-05-02 DIAGNOSIS — Z0000 Encounter for general adult medical examination without abnormal findings: Secondary | ICD-10-CM | POA: Diagnosis not present

## 2022-05-05 DIAGNOSIS — H40053 Ocular hypertension, bilateral: Secondary | ICD-10-CM | POA: Diagnosis not present

## 2022-05-05 DIAGNOSIS — E113293 Type 2 diabetes mellitus with mild nonproliferative diabetic retinopathy without macular edema, bilateral: Secondary | ICD-10-CM | POA: Diagnosis not present

## 2022-05-12 DIAGNOSIS — M25511 Pain in right shoulder: Secondary | ICD-10-CM | POA: Diagnosis not present

## 2022-05-12 DIAGNOSIS — M7581 Other shoulder lesions, right shoulder: Secondary | ICD-10-CM | POA: Diagnosis not present

## 2022-05-12 DIAGNOSIS — G8929 Other chronic pain: Secondary | ICD-10-CM | POA: Diagnosis not present

## 2022-05-16 ENCOUNTER — Ambulatory Visit
Admission: RE | Admit: 2022-05-16 | Discharge: 2022-05-16 | Disposition: A | Discharge status: Home / Self Care | Payer: 59 | Source: Ambulatory Visit | Attending: Internal Medicine | Admitting: Internal Medicine

## 2022-05-16 DIAGNOSIS — Z1231 Encounter for screening mammogram for malignant neoplasm of breast: Secondary | ICD-10-CM | POA: Diagnosis not present

## 2022-05-22 ENCOUNTER — Other Ambulatory Visit: Payer: Self-pay

## 2022-05-23 ENCOUNTER — Other Ambulatory Visit: Payer: Self-pay

## 2022-05-23 MED ORDER — ATORVASTATIN CALCIUM 10 MG PO TABS
ORAL_TABLET | Freq: Every day | ORAL | 1 refills | Status: DC
  Filled 2022-05-23: qty 90, 90d supply, fill #0
  Filled 2022-08-28: qty 90, 90d supply, fill #1

## 2022-05-25 ENCOUNTER — Other Ambulatory Visit: Payer: Self-pay

## 2022-05-25 MED ORDER — FLUCONAZOLE 100 MG PO TABS
ORAL_TABLET | ORAL | 0 refills | Status: AC
  Filled 2022-05-25: qty 10, 10d supply, fill #0

## 2022-06-01 NOTE — Therapy (Signed)
OUTPATIENT PHYSICAL THERAPY SHOULDER EVALUATION   Patient Name: Tonya Sampson MRN: [id number] DOB:April 08, 1976, 46 y.o., female Today's Date: 06/02/2022   PT End of Session - 06/02/22 1013     Visit Number 1    Number of Visits 17    Date for PT Re-Evaluation 07/28/22    Authorization Type eval: 06/02/22    PT Start Time 0850    PT Stop Time 0945    PT Time Calculation (min) 55 min    Activity Tolerance Patient tolerated treatment well    Behavior During Therapy [facility] for tasks assessed/performed             Past Medical History:  Diagnosis Date   Allergic rhinitis    Chickenpox    Claustrophobia    Diabetes ([place])    GERD (gastroesophageal reflux disease)    Hyperlipidemia associated with type 2 diabetes mellitus ([place])    Hypertension    Past Surgical History:  Procedure Laterality Date   DILITATION & CURRETTAGE/HYSTROSCOPY WITH NOVASURE ABLATION N/A 09/11/2019   Procedure: DILATATION & CURETTAGE/HYSTEROSCOPY W ABLATION;  Surgeon: Gae Dry, MD;  Location: [facility] ORS;  Service: Gynecology;  Laterality: N/A;   FOOT SURGERY  2010   nodule removed from right foot   LUMBAR DISC SURGERY  2008   discectomy ([facility])   Patient Active Problem List   Diagnosis Date Noted   S/P endometrial ablation 09/25/2019   Menorrhagia 07/08/2019   Hyperlipidemia associated with type 2 diabetes mellitus ([facility])    GERD (gastroesophageal reflux disease)    Hypertension    Viral upper respiratory infection 12/20/2015   Diabetes ([place]) 12/20/2015   Benign nevus 12/20/2015   Abnormal mammogram 12/20/2015    PCP: Tracie Harrier, MD  REFERRING PROVIDER: Corky Mull, MD  REFERRING DIAGNOSIS: M25.511 (ICD-10-CM) - Pain in right shoulder G89.29 (ICD-10-CM) - Other chronic pain M75.81 (ICD-10-CM) - Other shoulder lesions, right shoulder   THERAPY DIAG: Chronic right shoulder pain  Muscle weakness (generalized)  RATIONALE FOR EVALUATION AND TREATMENT: Rehabilitation  ONSET DATE:  06/02/2018 (approximate, symptoms started 4 years ago)  FOLLOW UP APPT WITH PROVIDER: Yes    SUBJECTIVE:                                                                                                                                                                                         Chief Complaint: R shoulder pain  Pertinent History Pt reports chronic R shoulder pain which first started approximately 4 years ago and developed without any specific cause or injury. She reports that the pain flairs occasionally and the symptoms have worsened over the last few months.  The pain is localized to the anterior shoulder. She tried taking meloxicam regularly for a couple weeks without any notable improvement. At one point she was exercising at the gym three times/week however now working out in the gym irritates her shoulder. She also gets pain if she reaches behind her back, reaches overhead, or rolls onto her R side while sleeping (wakes her up). She denies any neck pain, numbness/tingling, or focal weakness in RUE. She is R hand dominant. Pt saw a chiropractor for 6 visits without improvement. She had an appointment with Dr. Roland Rack who referred her for physical therapy for R rotator cuff tendinitis. She reports that he offered a steroid injection however she is a diabetic is concerned about an increase in her blood sugar. She had plain films of her R shoulder which demonstrated no evidence for fractures, lytic lesions, or significant degenerative changes. The subacromial space is well-maintained. There is no subacromial or infra-clavicular spurring. She demonstrates a Type I acromion.   Pain:  Pain Intensity: Present: 0/10, Best: 0/10, Worst: 6/10 Pain location: Anterior and anterolateral R shoulder Pain Quality:  pressure, deep ache   Radiating: No  Numbness/Tingling: No Focal Weakness: No Aggravating factors: reaching behind the back, overhead motion pain with wall push-up, rolling onto R side;   Relieving factors: stop the activity, ibuprofen 24-hour pain behavior: no change throughout the day, activity dependent History of prior shoulder or neck/shoulder injury, pain, surgery, or therapy: No, she did see a chiropractor for 6 visits without improvement Falls: Has patient fallen in last 6 months? 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  Recent memory is intact.  Remote memory is intact.  Attention span and concentration are intact.  Expressive speech is intact.  Patient's fund of knowledge is within normal limits for educational level.    Gross Musculoskeletal Assessment Tremor: None Bulk: Normal Tone: Normal No edema, ecchymosis, or erythema noted around R shoulder;  Gait Deferred  Posture Forward head and rounded shoulders in sitting position. Partially able to correct with cues. In supine both shoulder protract anteriorly  Cervical Screen AROM: WFL and painless with overpressure in all planes Spurlings A (ipsilateral lateral flexion/axial compression): R: Negative L: Negative Spurlings B (ipsilateral lateral flexion/contralateral rotation/axial compression): R: Negative L: Negative Repeated movement: No centralization or peripheralization with repeated retraction Hoffman Sign (cervical  cord compression): R: Not tested L: Not tested ULTT Median: R: Not examined L: Not examined ULTT Ulnar: R: Not examined L: Not examined ULTT Radial: R: Not examined L: Not examined   AROM  AROM (Normal range in degrees) AROM 06/02/2022  Cervical  Flexion (50) WNL  Extension (80) WNL  Right lateral flexion (45) WNL  Left lateral flexion (45) WNL  Right rotation (85) WNL  Left rotation (85) WNL   Right Left  Shoulder    Flexion 150 (Pain starts at 115) 170  Extension    Abduction  135 175  External Rotation 72 85  Internal Rotation 45 70  Hands Behind Head T3 T3  Hands Behind Back Belt line T5      Elbow    Flexion WNL WNL  Extension WNL WNL  Pronation WNL WNL  Supination WNL WNL  (* = pain; Blank rows = not tested)    LE MMT:  MMT (out of 5) Right 06/02/2022 Left 06/02/2022  Cervical (isometric)  Flexion WNL  Extension WNL  Lateral Flexion WNL WNL  Rotation WNL WNL      Shoulder   Flexion 5* 5  Extension 5 5  Abduction 5* 5  External rotation 5 5  Internal rotation 5 5  Horizontal abduction 4+ 4+  Horizontal adduction    Lower Trapezius 3+* 3+  Rhomboids 5 5      Elbow  Flexion 5 5  Extension 5 5  Pronation    Supination        Wrist  Flexion 5 5  Extension 5 5  Radial deviation    Ulnar deviation        MCP  Flexion 5 5  Extension 5 5  Abduction 5 5  Adduction 5 5  (* = pain; Blank rows = not tested)  Sensation Grossly intact to light touch bilateral UE as determined by testing dermatomes C2-T2. Proprioception and hot/cold testing deferred on this date.  Reflexes Deferred  Palpation  Location LEFT  RIGHT           Subocciptials  0  Cervical paraspinals  0  Upper Trapezius  0  Levator Scapulae  0  Rhomboid Major/Minor    Sternoclavicular joint    Acromioclavicular joint  0  Coracoid process  0  Long head of biceps  1  Supraspinatus  0  Infraspinatus  0  Subscapularis  0  Teres Minor  0  Teres Major  0  Pectoralis Major  0   Pectoralis Minor    Anterior Deltoid  0  Lateral Deltoid  0  Posterior Deltoid  0  Latissimus Dorsi  0  Sternocleidomastoid    (Blank rows = not tested) Graded on 0-4 scale (0 = no pain, 1 = pain, 2 = pain with wincing/grimacing/flinching, 3 = pain with withdrawal, 4 = unwilling to allow palpation), (Blank rows = not tested)   Repeated Movements No centralization or peripheralization of symptoms with repeated cervical retraction.   Passive Accessory Intervertebral Motion Pt denies reproduction of shoulder pain with CPA C2-T7 and UPA bilaterally C2-T7. Mild tenderness centrally in cervical spine with pressure but no R shoulder pain. Grossly hypomobile throughout thoracic spine;  Accessory Motions/Glides Glenohumeral: Posterior: R: abnormal L: normal Inferior: R: normal L: normal Anterior: R: normal L: normal  Acromioclavicular:  Posterior: R: normal L: normal Anterior: R: normal L: normal  Sternoclavicular: Posterior: R: normal L: normal Anterior: R: normal L: normal Superior: R: normal L: normal Inferior: R: normal L: normal  Scapulothoracic: Not examined  Muscle Length Testing Not examined   SPECIAL TESTS Rotator Cuff  Drop Arm Test: Negative Painful Arc (Pain from 60 to 120 degrees scaption): Negative Infraspinatus Muscle Test: Negative  Subacromial Impingement Hawkins-Kennedy: Positive Neer (Block scapula, PROM flexion): Positive Painful Arc (Pain from 60 to 120 degrees scaption): Negative (pt has pain at 115 to end range) Empty Can: Negative External Rotation Resistance: Negative Horizontal Adduction: Not examined Scapular Assist: Positive for decrease in pain  Labral Tear Biceps Load II (120 elevation, full ER, 90 elbow flexion, full supination,  resisted elbow flexion): Negative Crank (160 scaption, axial load with IR/ER): Negative Active Compression Test: Not examined  Bicep Tendon Pathology Speed (shoulder flexion to 90, external rotation, full  elbow extension, and forearm supination with resistance: Negative Yergason's (resisted shoulder ER and supination/biceps tendon pathology): Negative  Shoulder Instability Sulcus Sign: Negative Anterior Apprehension: Negative  Beighton scale: Deferred    TODAY'S TREATMENT   Ther-ex  Seated R shoulder isometric inferior glide 3s hold x 10; Seated dynamic hug with green tband x 10; Seated low rows with green tband x 10; Standing R shoulder IR stretch with dowel behind back 30s hold; Seated R pec doorway stretch x 30s; HEP issued and reviewed with patient in addition to SAIS education provided;   PATIENT EDUCATION:  Education details: Plan of care, SAIS, HEP Person educated: Patient Education method: Explanation and handout Education comprehension: verbalized understanding and returned demonstration   HOME EXERCISE PROGRAM: Access Code: [email or web address] URL: [email or web address] Date: 06/02/2022 Prepared by: Roxana Hires  Exercises - Seated Isometric Shoulder Inferior Glide  - 1 x daily - 7 x weekly - 2 sets - 10 reps - 3s hold - Dynamic Hug with Resistance  - 1 x daily - 7 x weekly - 2 sets - 10 reps - 3s hold - Standing Low Shoulder Row with Anchored Resistance  - 1 x daily - 7 x weekly - 2 sets - 10 reps - 3s hold - Standing Shoulder Internal Rotation Stretch with Towel  - 2 x daily - 7 x weekly - 2 sets - 10 reps - Single Arm Doorway Pec Stretch at 90 Degrees Abduction  - 2 x daily - 7 x weekly - 3 reps - 30s hold  Patient Education - Shoulder Impingement   ASSESSMENT:  CLINICAL IMPRESSION: Patient is a 46 y.o. female who was seen today for physical therapy evaluation and treatment for shoulder pain. Objective impairments include decreased ROM, decreased strength, impaired UE functional use, and pain. These impairments are limiting patient from occupation and yard work. Personal factors including Age, Time since onset of injury/illness/exacerbation, and 1  comorbidity: DM  are also affecting patient's functional outcome. Patient will benefit from skilled PT to address above impairments and improve overall function.  REHAB POTENTIAL: Excellent  CLINICAL DECISION MAKING: Stable/uncomplicated  EVALUATION COMPLEXITY: Low   GOALS: Goals reviewed with patient? Yes  SHORT TERM GOALS: Target date: 06/30/2022  Pt will be independent with HEP to improve strength and decrease neck pain to improve pain-free function at home and work. Baseline:  Goal status: INITIAL   LONG TERM GOALS: Target date: 07/28/2022  Pt will increase FOTO to at least 57 to demonstrate significant improvement in function at home and work related to neck pain  Baseline: 06/02/22: 39 Goal status: INITIAL  2.  Pt will decrease worst shoulder pain by at least 3 points on the NPRS in order to demonstrate clinically significant reduction in shoulder pain. Baseline: 06/02/22: worst: 6/10; Goal status: INITIAL  3.  Pt will decrease quick DASH score by at least 8% in order to demonstrate clinically significant reduction in disability related to shoulder pain        Baseline: 06/02/22: To be completed Goal status: INITIAL  4. Pt will increase strength of R low trap by at least 1/2 MMT grade in order to demonstrate improvement in strength and function         Baseline: 06/02/22: 3+/5 Goal status: INITIAL  5. Pt will increase R shoulder flexion, abduction and IR AROM by  at least 15 degrees each in order to restore normal shoulder motion to prevent pain with overhead and behind the back movements Baseline: 06/02/22: R shoulder: flexion: 150, Abduction: 135, IR: 45;  Goal status: INITIAL   PLAN: PT FREQUENCY: 1-2x/week  PT DURATION: 8 weeks  PLANNED INTERVENTIONS: Therapeutic exercises, Therapeutic activity, Neuromuscular re-education, Balance training, Gait training, Patient/Family education, Joint manipulation, Joint mobilization, Vestibular training, Canalith repositioning,  Aquatic Therapy, Dry Needling, Electrical stimulation, Spinal manipulation, Spinal mobilization, Cryotherapy, Moist heat, Traction, Ultrasound, Ionotophoresis '4mg'$ /ml Dexamethasone, and Manual therapy  PLAN FOR NEXT SESSION: Have pt complete QuickDASH (update goal), review/modify HEP as necessary, PAM for R shoulder, assess scapular PAM, progressive strengthening of R upper quarter  Angelle Isais D Keianna Signer PT, DPT, GCS  Naod Sweetland 06/02/2022, 10:43 AM

## 2022-06-02 ENCOUNTER — Ambulatory Visit: Payer: 59 | Attending: Surgery

## 2022-06-02 DIAGNOSIS — M6281 Muscle weakness (generalized): Secondary | ICD-10-CM | POA: Insufficient documentation

## 2022-06-02 DIAGNOSIS — G8929 Other chronic pain: Secondary | ICD-10-CM | POA: Insufficient documentation

## 2022-06-02 DIAGNOSIS — M25511 Pain in right shoulder: Secondary | ICD-10-CM | POA: Insufficient documentation

## 2022-06-05 ENCOUNTER — Other Ambulatory Visit: Payer: Self-pay

## 2022-06-05 NOTE — Therapy (Unsigned)
OUTPATIENT PHYSICAL THERAPY SHOULDER TREATMENT   Patient Name: Tonya Sampson MRN: [id number] DOB:September 03, 1976, 46 y.o., female Today's Date: 06/07/2022   PT End of Session - 06/06/22 1650     Visit Number 2    Number of Visits 17    Date for PT Re-Evaluation 07/28/22    Authorization Type eval: 06/02/22    PT Start Time 1700    PT Stop Time 1745    PT Time Calculation (min) 45 min    Activity Tolerance Patient tolerated treatment well    Behavior During Therapy [facility] for tasks assessed/performed              Past Medical History:  Diagnosis Date   Allergic rhinitis    Chickenpox    Claustrophobia    Diabetes ([place])    GERD (gastroesophageal reflux disease)    Hyperlipidemia associated with type 2 diabetes mellitus ([place])    Hypertension    Past Surgical History:  Procedure Laterality Date   DILITATION & CURRETTAGE/HYSTROSCOPY WITH NOVASURE ABLATION N/A 09/11/2019   Procedure: DILATATION & CURETTAGE/HYSTEROSCOPY W ABLATION;  Surgeon: Gae Dry, MD;  Location: [facility] ORS;  Service: Gynecology;  Laterality: N/A;   FOOT SURGERY  2010   nodule removed from right foot   LUMBAR DISC SURGERY  2008   discectomy ([facility])   Patient Active Problem List   Diagnosis Date Noted   S/P endometrial ablation 09/25/2019   Menorrhagia 07/08/2019   Hyperlipidemia associated with type 2 diabetes mellitus ([facility])    GERD (gastroesophageal reflux disease)    Hypertension    Viral upper respiratory infection 12/20/2015   Diabetes ([place]) 12/20/2015   Benign nevus 12/20/2015   Abnormal mammogram 12/20/2015    PCP: Tracie Harrier, MD  REFERRING PROVIDER: Corky Mull, MD  REFERRING DIAGNOSIS: M25.511 (ICD-10-CM) - Pain in right shoulder G89.29 (ICD-10-CM) - Other chronic pain M75.81 (ICD-10-CM) - Other shoulder lesions, right shoulder   THERAPY DIAG: Chronic right shoulder pain  Muscle weakness (generalized)  RATIONALE FOR EVALUATION AND TREATMENT: Rehabilitation  ONSET DATE:  06/02/2018 (approximate, symptoms started 4 years ago)  FOLLOW UP APPT WITH PROVIDER: Yes    SUBJECTIVE:                                                                                                                                                                                         Chief Complaint: R shoulder pain  Pertinent History Pt reports chronic R shoulder pain which first started approximately 4 years ago and developed without any specific cause or injury. She reports that the pain flairs occasionally and the symptoms have worsened over the last few  months. The pain is localized to the anterior shoulder. She tried taking meloxicam regularly for a couple weeks without any notable improvement. At one point she was exercising at the gym three times/week however now working out in the gym irritates her shoulder. She also gets pain if she reaches behind her back, reaches overhead, or rolls onto her R side while sleeping (wakes her up). She denies any neck pain, numbness/tingling, or focal weakness in RUE. She is R hand dominant. Pt saw a chiropractor for 6 visits without improvement. She had an appointment with Dr. Roland Rack who referred her for physical therapy for R rotator cuff tendinitis. She reports that he offered a steroid injection however she is a diabetic is concerned about an increase in her blood sugar. She had plain films of her R shoulder which demonstrated no evidence for fractures, lytic lesions, or significant degenerative changes. The subacromial space is well-maintained. There is no subacromial or infra-clavicular spurring. She demonstrates a Type I acromion.   Pain:  Pain Intensity: Present: 0/10, Best: 0/10, Worst: 6/10 Pain location: Anterior and anterolateral R shoulder Pain Quality:  pressure, deep ache   Radiating: No  Numbness/Tingling: No Focal Weakness: No Aggravating factors: reaching behind the back, overhead motion pain with wall push-up, rolling onto R side;   Relieving factors: stop the activity, ibuprofen 24-hour pain behavior: no change throughout the day, activity dependent History of prior shoulder or neck/shoulder injury, pain, surgery, or therapy: No, she did see a chiropractor for 6 visits without improvement Falls: Has patient fallen in last 6 months? 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  Recent memory is intact.  Remote memory is intact.  Attention span and concentration are intact.  Expressive speech is intact.  Patient's fund of knowledge is within normal limits for educational level.    Gross Musculoskeletal Assessment Tremor: None Bulk: Normal Tone: Normal No edema, ecchymosis, or erythema noted around R shoulder;  Gait Deferred  Posture Forward head and rounded shoulders in sitting position. Partially able to correct with cues. In supine both shoulder protract anteriorly  Cervical Screen AROM: WFL and painless with overpressure in all planes Spurlings A (ipsilateral lateral flexion/axial compression): R: Negative L: Negative Spurlings B (ipsilateral lateral flexion/contralateral rotation/axial compression): R: Negative L: Negative Repeated movement: No centralization or peripheralization with repeated retraction Hoffman Sign (cervical  cord compression): R: Not tested L: Not tested ULTT Median: R: Not examined L: Not examined ULTT Ulnar: R: Not examined L: Not examined ULTT Radial: R: Not examined L: Not examined   AROM  AROM (Normal range in degrees) AROM 06/07/2022  Cervical  Flexion (50) WNL  Extension (80) WNL  Right lateral flexion (45) WNL  Left lateral flexion (45) WNL  Right rotation (85) WNL  Left rotation (85) WNL   Right Left  Shoulder    Flexion 150 (Pain starts at 115) 170  Extension  Abduction 135 175  External Rotation 72 85  Internal Rotation 45 70  Hands Behind Head T3 T3  Hands Behind Back Belt line T5      Elbow    Flexion WNL WNL  Extension WNL WNL  Pronation WNL WNL  Supination WNL WNL  (* = pain; Blank rows = not tested)    LE MMT:  MMT (out of 5) Right 06/07/2022 Left 06/07/2022  Cervical (isometric)  Flexion WNL  Extension WNL  Lateral Flexion WNL WNL  Rotation WNL WNL      Shoulder   Flexion 5* 5  Extension 5 5  Abduction 5* 5  External rotation 5 5  Internal rotation 5 5  Horizontal abduction 4+ 4+  Horizontal adduction    Lower Trapezius 3+* 3+  Rhomboids 5 5      Elbow  Flexion 5 5  Extension 5 5  Pronation    Supination        Wrist  Flexion 5 5  Extension 5 5  Radial deviation    Ulnar deviation        MCP  Flexion 5 5  Extension 5 5  Abduction 5 5  Adduction 5 5  (* = pain; Blank rows = not tested)  Sensation Grossly intact to light touch bilateral UE as determined by testing dermatomes C2-T2. Proprioception and hot/cold testing deferred on this date.  Reflexes Deferred  Palpation  Location LEFT  RIGHT           Subocciptials  0  Cervical paraspinals  0  Upper Trapezius  0  Levator Scapulae  0  Rhomboid Major/Minor    Sternoclavicular joint    Acromioclavicular joint  0  Coracoid process  0  Long head of biceps  1  Supraspinatus  0  Infraspinatus  0  Subscapularis  0  Teres Minor  0  Teres Major  0  Pectoralis Major  0   Pectoralis Minor    Anterior Deltoid  0  Lateral Deltoid  0  Posterior Deltoid  0  Latissimus Dorsi  0  Sternocleidomastoid    (Blank rows = not tested) Graded on 0-4 scale (0 = no pain, 1 = pain, 2 = pain with wincing/grimacing/flinching, 3 = pain with withdrawal, 4 = unwilling to allow palpation), (Blank rows = not tested)   Repeated Movements No centralization or peripheralization of symptoms with repeated cervical retraction.   Passive Accessory Intervertebral Motion Pt denies reproduction of shoulder pain with CPA C2-T7 and UPA bilaterally C2-T7. Mild tenderness centrally in cervical spine with pressure but no R shoulder pain. Grossly hypomobile throughout thoracic spine;  Accessory Motions/Glides Glenohumeral: Posterior: R: abnormal L: normal Inferior: R: normal L: normal Anterior: R: normal L: normal  Acromioclavicular:  Posterior: R: normal L: normal Anterior: R: normal L: normal  Sternoclavicular: Posterior: R: normal L: normal Anterior: R: normal L: normal Superior: R: normal L: normal Inferior: R: normal L: normal  Scapulothoracic: Not examined  Muscle Length Testing Not examined   SPECIAL TESTS Rotator Cuff  Drop Arm Test: Negative Painful Arc (Pain from 60 to 120 degrees scaption): Negative Infraspinatus Muscle Test: Negative  Subacromial Impingement Hawkins-Kennedy: Positive Neer (Block scapula, PROM flexion): Positive Painful Arc (Pain from 60 to 120 degrees scaption): Negative (pt has pain at 115 to end range) Empty Can: Negative External Rotation Resistance: Negative Horizontal Adduction: Not examined Scapular Assist: Positive for decrease in pain  Labral Tear Biceps Load II (120 elevation, full ER, 90 elbow flexion, full  supination, resisted elbow flexion): Negative Crank (160 scaption, axial load with IR/ER): Negative Active Compression Test: Not examined  Bicep Tendon Pathology Speed (shoulder flexion to 90, external rotation, full  elbow extension, and forearm supination with resistance: Negative Yergason's (resisted shoulder ER and supination/biceps tendon pathology): Negative  Shoulder Instability Sulcus Sign: Negative Anterior Apprehension: Negative  Beighton scale: Deferred    TODAY'S TREATMENT    SUBJECTIVE: Pt reports that she is doing well today. No significant changes since the initial evaluation. She denies any resting R shoulder pain. She has been able to perform some of the exercises but not all of them since the initial evaluation. No specific questions or concerns, denies pain.   PAIN: Denies  Ther-ex  QuickDASH: 13.6% Supine R shoulder flexion from neutral to 90 degrees with 2# dumbbell (DB) 2 x 10; Supine R shoulder serratus punch at 90 flexion with 2# DB 2 x 10; Supine R shoulder circles clockwise/counterclockwise with 2# DB 2 x 10 each direction; Supine R shoulder AAROM with cane into flexion x 10; L sidelying R shoulder abduction from neutral to 90 abduction with 2# DB 2 x 10; L sidelying R shoulder ER with elbow at side and towel between elbow and body with 2# DB 2 x 10; Cold pack applied to R shoulder in supine while HEP updated, demonstrated, and reviewed with patient;   Manual Therapy  Moist heat pack applied to R shoulder in supine at start of session during interval history and HEP review x 5 minutes; Passive accessory mobility assessment of R scapula reveals decreased passive mobility in upward rotation; L sidelying R scapula upward rotation mobilizations, grade III, 30s/bout x 3 bouts; Supine R GH distraction progressing through passive abduction ROM; Supine R GH AP mobilizations at neutral, grade II-III (pain limited) 30s/bout x 3 bouts; Supine R GH inferior mobilizations at 90 abduction, grade II-III (pain limited) 30s/bout x 3 bouts; Supine R GH AP mobilizations at 90 abduction and available end range ER, grade II-III (pain limited) 30s/bout x 3 bouts;   PATIENT EDUCATION:   Education details: Pt educated throughout session about proper posture and technique with exercises. Improved exercise technique, movement at target joints, use of target muscles after min to mod verbal, visual, tactile cues, HEP updates; Person educated: Patient Education method: Explanation and handout Education comprehension: verbalized understanding and returned demonstration   HOME EXERCISE PROGRAM: Access Code: [email or web address] URL: [email or web address] Date: 06/06/2022 Prepared by: Roxana Hires  Exercises - Seated Isometric Shoulder Inferior Glide  - 1 x daily - 7 x weekly - 2 sets - 10 reps - 3s hold - Dynamic Hug with Resistance  - 1 x daily - 7 x weekly - 2 sets - 10 reps - 3s hold - Standing Low Shoulder Row with Anchored Resistance  - 1 x daily - 7 x weekly - 2 sets - 10 reps - 3s hold - Standing Shoulder Internal Rotation Stretch with Towel  - 2 x daily - 7 x weekly - 3 reps - 30s hold - Seated Shoulder Flexion Table Top Stretch  - 2 x daily - 7 x weekly - 3 reps - 30s hold - Single Arm Doorway Pec Stretch at 90 Degrees Abduction  - 2 x daily - 7 x weekly - 3 reps - 30s hold  Patient Education - Shoulder Impingement   ASSESSMENT:  CLINICAL IMPRESSION: Patient demonstrates excellent motivation during session today. Initiated manual techniques as well as R upper quarter strengthening with patient during appointment. She demonstrates decreased  R scapula upward rotation mobility during assessment. QuickDASH reveals 13.6% self-reported disability related to her R shoulder pain. HEP updated to include seated R shoulder flexion stretch (pain-free). Pt encouraged to follow-up as scheduled. Patient will benefit from skilled PT to address above impairments and improve overall function.  REHAB POTENTIAL: Excellent  CLINICAL DECISION MAKING: Stable/uncomplicated  EVALUATION COMPLEXITY: Low   GOALS: Goals reviewed with patient? Yes  SHORT TERM GOALS: Target date:  06/30/2022  Pt will be independent with HEP to improve strength and decrease neck pain to improve pain-free function at home and work. Baseline:  Goal status: INITIAL   LONG TERM GOALS: Target date: 07/28/2022  Pt will increase FOTO to at least 57 to demonstrate significant improvement in function at home and work related to neck pain  Baseline: 06/02/22: 39 Goal status: INITIAL  2.  Pt will decrease worst shoulder pain by at least 3 points on the NPRS in order to demonstrate clinically significant reduction in shoulder pain. Baseline: 06/02/22: worst: 6/10; Goal status: INITIAL  3.  Pt will decrease quick DASH score by at least 8% in order to demonstrate clinically significant reduction in disability related to shoulder pain        Baseline: 06/02/22: To be completed; 06/06/22: 13.6% Goal status: INITIAL  4. Pt will increase strength of R low trap by at least 1/2 MMT grade in order to demonstrate improvement in strength and function         Baseline: 06/02/22: 3+/5 Goal status: INITIAL  5. Pt will increase R shoulder flexion, abduction and IR AROM by at least 15 degrees each in order to restore normal shoulder motion to prevent pain with overhead and behind the back movements Baseline: 06/02/22: R shoulder: flexion: 150, Abduction: 135, IR: 45;  Goal status: INITIAL   PLAN: PT FREQUENCY: 1-2x/week  PT DURATION: 8 weeks  PLANNED INTERVENTIONS: Therapeutic exercises, Therapeutic activity, Neuromuscular re-education, Balance training, Gait training, Patient/Family education, Joint manipulation, Joint mobilization, Vestibular training, Canalith repositioning, Aquatic Therapy, Dry Needling, Electrical stimulation, Spinal manipulation, Spinal mobilization, Cryotherapy, Moist heat, Traction, Ultrasound, Ionotophoresis '4mg'$ /ml Dexamethasone, and Manual therapy  PLAN FOR NEXT SESSION:  progressive strengthening of R upper quarter with manual techniques to improve ROM  Maripat Borba D Lidwina Kaner PT, DPT,  [facility]  Anastassia Noack 06/07/2022, 10:59 AM

## 2022-06-06 ENCOUNTER — Other Ambulatory Visit: Payer: Self-pay

## 2022-06-06 ENCOUNTER — Ambulatory Visit: Payer: 59 | Attending: Surgery

## 2022-06-06 DIAGNOSIS — M6281 Muscle weakness (generalized): Secondary | ICD-10-CM | POA: Diagnosis not present

## 2022-06-06 DIAGNOSIS — R42 Dizziness and giddiness: Secondary | ICD-10-CM | POA: Diagnosis not present

## 2022-06-06 DIAGNOSIS — G8929 Other chronic pain: Secondary | ICD-10-CM | POA: Insufficient documentation

## 2022-06-06 DIAGNOSIS — M25511 Pain in right shoulder: Secondary | ICD-10-CM | POA: Insufficient documentation

## 2022-06-06 MED ORDER — SEMAGLUTIDE (1 MG/DOSE) 4 MG/3ML ~~LOC~~ SOPN
PEN_INJECTOR | SUBCUTANEOUS | 5 refills | Status: DC
  Filled 2022-06-06: qty 3, 28d supply, fill #0
  Filled 2022-06-30: qty 3, 28d supply, fill #1
  Filled 2022-08-28: qty 3, 28d supply, fill #2
  Filled 2022-09-29: qty 3, 28d supply, fill #3
  Filled 2022-10-27: qty 3, 28d supply, fill #4
  Filled 2022-11-24 (×2): qty 3, 28d supply, fill #5

## 2022-06-07 ENCOUNTER — Other Ambulatory Visit: Payer: Self-pay

## 2022-06-08 ENCOUNTER — Ambulatory Visit: Payer: 59

## 2022-06-08 ENCOUNTER — Other Ambulatory Visit: Payer: Self-pay

## 2022-06-08 DIAGNOSIS — M6281 Muscle weakness (generalized): Secondary | ICD-10-CM

## 2022-06-08 DIAGNOSIS — M25511 Pain in right shoulder: Secondary | ICD-10-CM | POA: Diagnosis not present

## 2022-06-08 DIAGNOSIS — G8929 Other chronic pain: Secondary | ICD-10-CM | POA: Diagnosis not present

## 2022-06-08 DIAGNOSIS — R42 Dizziness and giddiness: Secondary | ICD-10-CM | POA: Diagnosis not present

## 2022-06-08 NOTE — Therapy (Signed)
OUTPATIENT PHYSICAL THERAPY SHOULDER TREATMENT   Patient Name: Tonya Sampson MRN: [id number] DOB:Nov 05, 1976, 46 y.o., female Today's Date: 06/09/2022   PT End of Session - 06/08/22 1703     Visit Number 3    Number of Visits 17    Date for PT Re-Evaluation 07/28/22    Authorization Type eval: 06/02/22    PT Start Time 1703    PT Stop Time 1750    PT Time Calculation (min) 47 min    Activity Tolerance Patient tolerated treatment well    Behavior During Therapy [facility] for tasks assessed/performed             Past Medical History:  Diagnosis Date   Allergic rhinitis    Chickenpox    Claustrophobia    Diabetes ([place])    GERD (gastroesophageal reflux disease)    Hyperlipidemia associated with type 2 diabetes mellitus ([place])    Hypertension    Past Surgical History:  Procedure Laterality Date   DILITATION & CURRETTAGE/HYSTROSCOPY WITH NOVASURE ABLATION N/A 09/11/2019   Procedure: DILATATION & CURETTAGE/HYSTEROSCOPY W ABLATION;  Surgeon: Gae Dry, MD;  Location: [facility] ORS;  Service: Gynecology;  Laterality: N/A;   FOOT SURGERY  2010   nodule removed from right foot   LUMBAR DISC SURGERY  2008   discectomy ([facility])   Patient Active Problem List   Diagnosis Date Noted   S/P endometrial ablation 09/25/2019   Menorrhagia 07/08/2019   Hyperlipidemia associated with type 2 diabetes mellitus ([facility])    GERD (gastroesophageal reflux disease)    Hypertension    Viral upper respiratory infection 12/20/2015   Diabetes ([place]) 12/20/2015   Benign nevus 12/20/2015   Abnormal mammogram 12/20/2015    PCP: Tracie Harrier, MD  REFERRING PROVIDER: Corky Mull, MD  REFERRING DIAGNOSIS: M25.511 (ICD-10-CM) - Pain in right shoulder G89.29 (ICD-10-CM) - Other chronic pain M75.81 (ICD-10-CM) - Other shoulder lesions, right shoulder   THERAPY DIAG: Chronic right shoulder pain  Muscle weakness (generalized)  RATIONALE FOR EVALUATION AND TREATMENT: Rehabilitation  ONSET DATE:  06/02/2018 (approximate, symptoms started 4 years ago)  FOLLOW UP APPT WITH PROVIDER: Yes    SUBJECTIVE:                                                                                                                                                                                         Chief Complaint: R shoulder pain  Pertinent History Pt reports chronic R shoulder pain which first started approximately 4 years ago and developed without any specific cause or injury. She reports that the pain flairs occasionally and the symptoms have worsened over the last few months.  The pain is localized to the anterior shoulder. She tried taking meloxicam regularly for a couple weeks without any notable improvement. At one point she was exercising at the gym three times/week however now working out in the gym irritates her shoulder. She also gets pain if she reaches behind her back, reaches overhead, or rolls onto her R side while sleeping (wakes her up). She denies any neck pain, numbness/tingling, or focal weakness in RUE. She is R hand dominant. Pt saw a chiropractor for 6 visits without improvement. She had an appointment with Dr. Roland Rack who referred her for physical therapy for R rotator cuff tendinitis. She reports that he offered a steroid injection however she is a diabetic is concerned about an increase in her blood sugar. She had plain films of her R shoulder which demonstrated no evidence for fractures, lytic lesions, or significant degenerative changes. The subacromial space is well-maintained. There is no subacromial or infra-clavicular spurring. She demonstrates a Type I acromion.   Pain:  Pain Intensity: Present: 0/10, Best: 0/10, Worst: 6/10 Pain location: Anterior and anterolateral R shoulder Pain Quality:  pressure, deep ache   Radiating: No  Numbness/Tingling: No Focal Weakness: No Aggravating factors: reaching behind the back, overhead motion pain with wall push-up, rolling onto R side;   Relieving factors: stop the activity, ibuprofen 24-hour pain behavior: no change throughout the day, activity dependent History of prior shoulder or neck/shoulder injury, pain, surgery, or therapy: No, she did see a chiropractor for 6 visits without improvement Falls: Has patient fallen in last 6 months? 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  Recent memory is intact.  Remote memory is intact.  Attention span and concentration are intact.  Expressive speech is intact.  Patient's fund of knowledge is within normal limits for educational level.    Gross Musculoskeletal Assessment Tremor: None Bulk: Normal Tone: Normal No edema, ecchymosis, or erythema noted around R shoulder;  Gait Deferred  Posture Forward head and rounded shoulders in sitting position. Partially able to correct with cues. In supine both shoulder protract anteriorly  Cervical Screen AROM: WFL and painless with overpressure in all planes Spurlings A (ipsilateral lateral flexion/axial compression): R: Negative L: Negative Spurlings B (ipsilateral lateral flexion/contralateral rotation/axial compression): R: Negative L: Negative Repeated movement: No centralization or peripheralization with repeated retraction Hoffman Sign (cervical  cord compression): R: Not tested L: Not tested ULTT Median: R: Not examined L: Not examined ULTT Ulnar: R: Not examined L: Not examined ULTT Radial: R: Not examined L: Not examined   AROM  AROM (Normal range in degrees) AROM 06/09/2022  Cervical  Flexion (50) WNL  Extension (80) WNL  Right lateral flexion (45) WNL  Left lateral flexion (45) WNL  Right rotation (85) WNL  Left rotation (85) WNL   Right Left  Shoulder    Flexion 150 (Pain starts at 115) 170  Extension    Abduction  135 175  External Rotation 72 85  Internal Rotation 45 70  Hands Behind Head T3 T3  Hands Behind Back Belt line T5      Elbow    Flexion WNL WNL  Extension WNL WNL  Pronation WNL WNL  Supination WNL WNL  (* = pain; Blank rows = not tested)    LE MMT:  MMT (out of 5) Right 06/09/2022 Left 06/09/2022  Cervical (isometric)  Flexion WNL  Extension WNL  Lateral Flexion WNL WNL  Rotation WNL WNL      Shoulder   Flexion 5* 5  Extension 5 5  Abduction 5* 5  External rotation 5 5  Internal rotation 5 5  Horizontal abduction 4+ 4+  Horizontal adduction    Lower Trapezius 3+* 3+  Rhomboids 5 5      Elbow  Flexion 5 5  Extension 5 5  Pronation    Supination        Wrist  Flexion 5 5  Extension 5 5  Radial deviation    Ulnar deviation        MCP  Flexion 5 5  Extension 5 5  Abduction 5 5  Adduction 5 5  (* = pain; Blank rows = not tested)  Sensation Grossly intact to light touch bilateral UE as determined by testing dermatomes C2-T2. Proprioception and hot/cold testing deferred on this date.  Reflexes Deferred  Palpation  Location LEFT  RIGHT           Subocciptials  0  Cervical paraspinals  0  Upper Trapezius  0  Levator Scapulae  0  Rhomboid Major/Minor    Sternoclavicular joint    Acromioclavicular joint  0  Coracoid process  0  Long head of biceps  1  Supraspinatus  0  Infraspinatus  0  Subscapularis  0  Teres Minor  0  Teres Major  0  Pectoralis Major  0   Pectoralis Minor    Anterior Deltoid  0  Lateral Deltoid  0  Posterior Deltoid  0  Latissimus Dorsi  0  Sternocleidomastoid    (Blank rows = not tested) Graded on 0-4 scale (0 = no pain, 1 = pain, 2 = pain with wincing/grimacing/flinching, 3 = pain with withdrawal, 4 = unwilling to allow palpation), (Blank rows = not tested)   Repeated Movements No centralization or peripheralization of symptoms with repeated cervical retraction.   Passive Accessory Intervertebral Motion Pt denies reproduction of shoulder pain with CPA C2-T7 and UPA bilaterally C2-T7. Mild tenderness centrally in cervical spine with pressure but no R shoulder pain. Grossly hypomobile throughout thoracic spine;  Accessory Motions/Glides Glenohumeral: Posterior: R: abnormal L: normal Inferior: R: normal L: normal Anterior: R: normal L: normal  Acromioclavicular:  Posterior: R: normal L: normal Anterior: R: normal L: normal  Sternoclavicular: Posterior: R: normal L: normal Anterior: R: normal L: normal Superior: R: normal L: normal Inferior: R: normal L: normal  Scapulothoracic: Not examined  Muscle Length Testing Not examined   SPECIAL TESTS Rotator Cuff  Drop Arm Test: Negative Painful Arc (Pain from 60 to 120 degrees scaption): Negative Infraspinatus Muscle Test: Negative  Subacromial Impingement Hawkins-Kennedy: Positive Neer (Block scapula, PROM flexion): Positive Painful Arc (Pain from 60 to 120 degrees scaption): Negative (pt has pain at 115 to end range) Empty Can: Negative External Rotation Resistance: Negative Horizontal Adduction: Not examined Scapular Assist: Positive for decrease in pain  Labral Tear Biceps Load II (120 elevation, full ER, 90 elbow flexion, full supination,  resisted elbow flexion): Negative Crank (160 scaption, axial load with IR/ER): Negative Active Compression Test: Not examined  Bicep Tendon Pathology Speed (shoulder flexion to 90, external rotation, full  elbow extension, and forearm supination with resistance: Negative Yergason's (resisted shoulder ER and supination/biceps tendon pathology): Negative  Shoulder Instability Sulcus Sign: Negative Anterior Apprehension: Negative  Beighton scale: Deferred    TODAY'S TREATMENT    SUBJECTIVE: Pt reports that she is doing well today. No significant changes since the last therapy session with the exception of appropriate DOMS. She denies any resting R shoulder pain upon arrival today. No specific questions or concerns, denies pain.    PAIN: Denies   Ther-ex  UBE x 5 minutes (2.5 forward/2.5 backward) for warm-up during history (unbilled); Prone R shoulder extension with 2# dumbbell (DB) 2 x 10; Prone R shoulder low row with 2# DB 2 x 10; Prone R shoulder horizontal abduction with 2# DB 2 x 10; Prone R shoulder low trap flexion with 2# DB 2 x 10; Prone R shoulder ER at 90 abduction with 2# DB 2 x 10; Seated R shoulder flexion to 90 degrees with 2# DB 2 x 10; Seated R shoulder scaption to 90 degrees with 2# DB 2 x 10; Standing shoulder IR AAROM with cane behind back x 10; Improvement in functional R shoulder IR HBB from waistline at initial evaluation to around L3 today;   Manual Therapy  Supine R GH distraction progressing through passive abduction ROM; Supine R GH AP mobilizations at neutral, grade II-III (pain limited) 30s/bout x 3 bouts; Supine R GH inferior mobilizations at 90 abduction, grade II-III (pain limited) 30s/bout x 3 bouts; Supine R GH AP mobilizations at 90 abduction and available end range ER, grade II-III (pain limited) 30s/bout x 3 bouts; Supine R GH AP mobilizations at 90 flexion through long axis, grade II-III, 30s/bout x 3 bouts;  R shoulder AAROM measurements after manual therapy:  Flexion: 154 Abduction: 135 ER: 70 IR: 64   Not performed: Supine R shoulder flexion from neutral to 90 degrees with 2# dumbbell (DB) 2 x 10; Supine R shoulder serratus punch  at 90 flexion with 2# DB 2 x 10; Supine R shoulder circles clockwise/counterclockwise with 2# DB 2 x 10 each direction; Supine R shoulder AAROM with cane into flexion x 10; L sidelying R shoulder abduction from neutral to 90 abduction with 2# DB 2 x 10; L sidelying R shoulder ER with elbow at side and towel between elbow and body with 2# DB 2 x 10; L sidelying R scapula upward rotation mobilizations, grade III, 30s/bout x 3 bouts;   PATIENT EDUCATION:  Education details: Pt educated throughout session about proper posture and technique with exercises. Improved exercise technique, movement at target joints, use of target muscles after min to mod verbal, visual, tactile cues, Person educated: Patient Education method: Explanation Education comprehension: verbalized understanding and returned demonstration   HOME EXERCISE PROGRAM: Access Code: [email or web address] URL: [email or web address] Date: 06/06/2022 Prepared by: Roxana Hires  Exercises - Seated Isometric Shoulder Inferior Glide  - 1 x daily - 7 x weekly - 2 sets - 10 reps - 3s hold - Dynamic Hug with Resistance  - 1 x daily - 7 x weekly - 2 sets - 10 reps - 3s hold - Standing Low Shoulder Row with Anchored Resistance  - 1 x daily - 7 x weekly - 2 sets - 10 reps - 3s hold - Standing Shoulder Internal Rotation Stretch with Towel  - 2 x  daily - 7 x weekly - 3 reps - 30s hold - Seated Shoulder Flexion Table Top Stretch  - 2 x daily - 7 x weekly - 3 reps - 30s hold - Single Arm Doorway Pec Stretch at 90 Degrees Abduction  - 2 x daily - 7 x weekly - 3 reps - 30s hold  Patient Education - Shoulder Impingement   ASSESSMENT:  CLINICAL IMPRESSION: Patient demonstrates excellent motivation during session today. Continued manual techniques as well as R upper quarter strengthening with patient during appointment. Introduced isolated prone strengthening and also performed some seated strengthening. Improvement in functional R shoulder  IR HBB from waistline at initial evaluation to around L3 today noted. No HEP updates on this date. Pt encouraged to follow-up as scheduled. Patient will benefit from skilled PT to address above impairments and improve overall function.  REHAB POTENTIAL: Excellent  CLINICAL DECISION MAKING: Stable/uncomplicated  EVALUATION COMPLEXITY: Low   GOALS: Goals reviewed with patient? Yes  SHORT TERM GOALS: Target date: 06/30/2022  Pt will be independent with HEP to improve strength and decrease neck pain to improve pain-free function at home and work. Baseline:  Goal status: INITIAL   LONG TERM GOALS: Target date: 07/28/2022  Pt will increase FOTO to at least 57 to demonstrate significant improvement in function at home and work related to neck pain  Baseline: 06/02/22: 39 Goal status: INITIAL  2.  Pt will decrease worst shoulder pain by at least 3 points on the NPRS in order to demonstrate clinically significant reduction in shoulder pain. Baseline: 06/02/22: worst: 6/10; Goal status: INITIAL  3.  Pt will decrease quick DASH score by at least 8% in order to demonstrate clinically significant reduction in disability related to shoulder pain        Baseline: 06/02/22: To be completed; 06/06/22: 13.6% Goal status: INITIAL  4. Pt will increase strength of R low trap by at least 1/2 MMT grade in order to demonstrate improvement in strength and function         Baseline: 06/02/22: 3+/5 Goal status: INITIAL  5. Pt will increase R shoulder flexion, abduction and IR AROM by at least 15 degrees each in order to restore normal shoulder motion to prevent pain with overhead and behind the back movements Baseline: 06/02/22: R shoulder: flexion: 150, Abduction: 135, IR: 45;  Goal status: INITIAL   PLAN: PT FREQUENCY: 1-2x/week  PT DURATION: 8 weeks  PLANNED INTERVENTIONS: Therapeutic exercises, Therapeutic activity, Neuromuscular re-education, Balance training, Gait training, Patient/Family education,  Joint manipulation, Joint mobilization, Vestibular training, Canalith repositioning, Aquatic Therapy, Dry Needling, Electrical stimulation, Spinal manipulation, Spinal mobilization, Cryotherapy, Moist heat, Traction, Ultrasound, Ionotophoresis '4mg'$ /ml Dexamethasone, and Manual therapy  PLAN FOR NEXT SESSION:  progressive strengthening of R upper quarter with manual techniques to improve ROM  Lyndel Safe Josiah Wojtaszek PT, DPT, GCS  Ibrahem Volkman 06/09/2022, 4:07 PM

## 2022-06-12 NOTE — Therapy (Unsigned)
OUTPATIENT PHYSICAL THERAPY SHOULDER TREATMENT   Patient Name: Tonya Sampson MRN: [id number] DOB:1976-03-30, 46 y.o., female Today's Date: 06/14/2022   PT End of Session - 06/13/22 1700     Visit Number 4    Number of Visits 17    Date for PT Re-Evaluation 07/28/22    Authorization Type eval: 06/02/22    PT Start Time 1659    PT Stop Time 1744    PT Time Calculation (min) 45 min    Activity Tolerance Patient tolerated treatment well    Behavior During Therapy WFL for tasks assessed/performed              Past Medical History:  Diagnosis Date   Allergic rhinitis    Chickenpox    Claustrophobia    Diabetes ([place])    GERD (gastroesophageal reflux disease)    Hyperlipidemia associated with type 2 diabetes mellitus ([place])    Hypertension    Past Surgical History:  Procedure Laterality Date   DILITATION & CURRETTAGE/HYSTROSCOPY WITH NOVASURE ABLATION N/A 09/11/2019   Procedure: DILATATION & CURETTAGE/HYSTEROSCOPY W ABLATION;  Surgeon: Gae Dry, MD;  Location: [facility];  Service: Gynecology;  Laterality: N/A;   FOOT SURGERY  2010   nodule removed from right foot   LUMBAR DISC SURGERY  2008   discectomy ([facility])   Patient Active Problem List   Diagnosis Date Noted   S/P endometrial ablation 09/25/2019   Menorrhagia 07/08/2019   Hyperlipidemia associated with type 2 diabetes mellitus ([facility])    GERD (gastroesophageal reflux disease)    Hypertension    Viral upper respiratory infection 12/20/2015   Diabetes ([place]) 12/20/2015   Benign nevus 12/20/2015   Abnormal mammogram 12/20/2015    PCP: Tracie Harrier, MD  REFERRING PROVIDER: Corky Mull, MD  REFERRING DIAGNOSIS: M25.511 (ICD-10-CM) - Pain in right shoulder G89.29 (ICD-10-CM) - Other chronic pain M75.81 (ICD-10-CM) - Other shoulder lesions, right shoulder   THERAPY DIAG: Chronic right shoulder pain  Muscle weakness (generalized)  Dizziness and giddiness  RATIONALE FOR EVALUATION AND TREATMENT:  Rehabilitation  ONSET DATE: 06/02/2018 (approximate, symptoms started 4 years ago)  FOLLOW UP APPT WITH PROVIDER: Yes    SUBJECTIVE:                                                                                                                                                                                         Chief Complaint: R shoulder pain  Pertinent History Pt reports chronic R shoulder pain which first started approximately 4 years ago and developed without any specific cause or injury. She reports that the pain flairs occasionally and the symptoms have worsened  over the last few months. The pain is localized to the anterior shoulder. She tried taking meloxicam regularly for a couple weeks without any notable improvement. At one point she was exercising at the gym three times/week however now working out in the gym irritates her shoulder. She also gets pain if she reaches behind her back, reaches overhead, or rolls onto her R side while sleeping (wakes her up). She denies any neck pain, numbness/tingling, or focal weakness in RUE. She is R hand dominant. Pt saw a chiropractor for 6 visits without improvement. She had an appointment with Dr. Roland Rack who referred her for physical therapy for R rotator cuff tendinitis. She reports that he offered a steroid injection however she is a diabetic is concerned about an increase in her blood sugar. She had plain films of her R shoulder which demonstrated no evidence for fractures, lytic lesions, or significant degenerative changes. The subacromial space is well-maintained. There is no subacromial or infra-clavicular spurring. She demonstrates a Type I acromion.   Pain:  Pain Intensity: Present: 0/10, Best: 0/10, Worst: 6/10 Pain location: Anterior and anterolateral R shoulder Pain Quality:  pressure, deep ache   Radiating: No  Numbness/Tingling: No Focal Weakness: No Aggravating factors: reaching behind the back, overhead motion pain with wall  push-up, rolling onto R side;  Relieving factors: stop the activity, ibuprofen 24-hour pain behavior: no change throughout the day, activity dependent History of prior shoulder or neck/shoulder injury, pain, surgery, or therapy: No, she did see a chiropractor for 6 visits without improvement Falls: Has patient fallen in last 6 months? 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  Recent memory is intact.  Remote memory is intact.  Attention span and concentration are intact.  Expressive speech is intact.  Patient's fund of knowledge is within normal limits for educational level.    Gross Musculoskeletal Assessment Tremor: None Bulk: Normal Tone: Normal No edema, ecchymosis, or erythema noted around R shoulder;  Gait Deferred  Posture Forward head and rounded shoulders in sitting position. Partially able to correct with cues. In supine both shoulder protract anteriorly  Cervical Screen AROM: WFL and painless with overpressure in all planes Spurlings A (ipsilateral lateral flexion/axial compression): R: Negative L: Negative Spurlings B (ipsilateral lateral flexion/contralateral rotation/axial compression): R: Negative L: Negative Repeated movement: No centralization or peripheralization with repeated  retraction Hoffman Sign (cervical cord compression): R: Not tested L: Not tested ULTT Median: R: Not examined L: Not examined ULTT Ulnar: R: Not examined L: Not examined ULTT Radial: R: Not examined L: Not examined   AROM  AROM (Normal range in degrees) AROM 06/14/2022  Cervical  Flexion (50) WNL  Extension (80) WNL  Right lateral flexion (45) WNL  Left lateral flexion (45) WNL  Right rotation (85) WNL  Left rotation (85) WNL   Right Left  Shoulder    Flexion 150 (Pain starts at 115) 170  Extension    Abduction 135 175  External Rotation 72 85  Internal Rotation 45 70  Hands Behind Head T3 T3  Hands Behind Back Belt line T5      Elbow    Flexion WNL WNL  Extension WNL WNL  Pronation WNL WNL  Supination WNL WNL  (* = pain; Blank rows = not tested)    LE MMT:  MMT (out of 5) Right 06/14/2022 Left 06/14/2022  Cervical (isometric)  Flexion WNL  Extension WNL  Lateral Flexion WNL WNL  Rotation WNL WNL      Shoulder   Flexion 5* 5  Extension 5 5  Abduction 5* 5  External rotation 5 5  Internal rotation 5 5  Horizontal abduction 4+ 4+  Horizontal adduction    Lower Trapezius 3+* 3+  Rhomboids 5 5      Elbow  Flexion 5 5  Extension 5 5  Pronation    Supination        Wrist  Flexion 5 5  Extension 5 5  Radial deviation    Ulnar deviation        MCP  Flexion 5 5  Extension 5 5  Abduction 5 5  Adduction 5 5  (* = pain; Blank rows = not tested)  Sensation Grossly intact to light touch bilateral UE as determined by testing dermatomes C2-T2. Proprioception and hot/cold testing deferred on this date.  Reflexes Deferred  Palpation  Location LEFT  RIGHT           Subocciptials  0  Cervical paraspinals  0  Upper Trapezius  0  Levator Scapulae  0  Rhomboid Major/Minor    Sternoclavicular joint    Acromioclavicular joint  0  Coracoid process  0  Long head of biceps  1  Supraspinatus  0  Infraspinatus  0  Subscapularis  0  Teres Minor  0  Teres  Major  0  Pectoralis Major  0  Pectoralis Minor    Anterior Deltoid  0  Lateral Deltoid  0  Posterior Deltoid  0  Latissimus Dorsi  0  Sternocleidomastoid    (Blank rows = not tested) Graded on 0-4 scale (0 = no pain, 1 = pain, 2 = pain with wincing/grimacing/flinching, 3 = pain with withdrawal, 4 = unwilling to allow palpation), (Blank rows = not tested)   Repeated Movements No centralization or peripheralization of symptoms with repeated cervical retraction.   Passive Accessory Intervertebral Motion Pt denies reproduction of shoulder pain with CPA C2-T7 and UPA bilaterally C2-T7. Mild tenderness centrally in cervical spine with pressure but no R shoulder pain. Grossly hypomobile throughout thoracic spine;  Accessory Motions/Glides Glenohumeral: Posterior: R: abnormal L: normal Inferior: R: normal L: normal Anterior: R: normal L: normal  Acromioclavicular:  Posterior: R: normal L: normal Anterior: R: normal L: normal  Sternoclavicular: Posterior: R: normal L: normal Anterior: R: normal L: normal Superior: R: normal L: normal Inferior: R: normal L: normal  Scapulothoracic: Not examined  Muscle Length Testing Not examined   SPECIAL TESTS Rotator Cuff  Drop Arm Test: Negative Painful Arc (Pain from 60 to 120 degrees scaption): Negative Infraspinatus Muscle Test: Negative  Subacromial Impingement Hawkins-Kennedy: Positive Neer (Block scapula, PROM flexion): Positive Painful Arc (Pain from 60 to 120 degrees scaption): Negative (pt has pain at 115 to end range) Empty Can: Negative External Rotation Resistance: Negative Horizontal Adduction: Not examined Scapular Assist: Positive for decrease in pain  Labral Tear Biceps Load II (120 elevation, full ER,  90 elbow flexion, full supination, resisted elbow flexion): Negative Crank (160 scaption, axial load with IR/ER): Negative Active Compression Test: Not examined  Bicep Tendon Pathology Speed (shoulder flexion to  90, external rotation, full elbow extension, and forearm supination with resistance: Negative Yergason's (resisted shoulder ER and supination/biceps tendon pathology): Negative  Shoulder Instability Sulcus Sign: Negative Anterior Apprehension: Negative  Beighton scale: Deferred    TODAY'S TREATMENT    SUBJECTIVE: Pt reports that she is doing well today. No significant changes since the last therapy session however she has noticed a slight decrease in the frequency with which she experiences the R shoulder pain. She denies any resting R shoulder pain upon arrival today. No specific questions or concerns, denies pain.    PAIN: Denies   Ther-ex  UBE x 5 minutes (2.5 forward/2.5 backward) for warm-up during history (unbilled); L sidelying R shoulder abduction from neutral to 90 abduction with manual resistance 2 x 10; L sidelying R shoulder ER with elbow at side with manual resistance 2 x 10;   Manual Therapy  Supine R GH distraction progressing through passive abduction ROM; Supine R GH AP mobilizations at neutral, grade II-III (pain limited) 30s/bout x 3 bouts; Supine R GH inferior mobilizations at 90 abduction, grade II-III (pain limited) 30s/bout x 3 bouts; Supine R GH AP mobilizations at 90 abduction and available end range ER, grade II-III (pain limited) 30s/bout x 3 bouts; Supine R GH AP mobilizations at 90 flexion through long axis, grade II-III, 30s/bout x 3 bouts; L sidelying R supraspinatus, infraspinatus, and teres minor aggressive STM; Improved pain-free R shoulder flexion and abduction at end of session. Pt also demonstrates improved R shoulder IR (HBB) to around L1    Trigger Point Dry Needling (TDN), unbilled Education performed with patient regarding potential benefit of TDN. Reviewed precautions and risks with patient. Reviewed special precautions/risks over lung fields which include pneumothorax. Reviewed signs and symptoms of pneumothorax and advised pt to go to  ER immediately if these symptoms develop advise them of dry needling treatment. Extensive time spent with pt to ensure full understanding of TDN risks. Pt provided verbal consent to treatment. In left sidelying position TDN performed to R supraspinatus with 1, 0.25 x 40 single needle placement with local twitch response (LTR) (scapula contacted). Pistoning technique utilized. Pt becomes diaphoretic and lightheaded during needling so terminated early and not repeated.    Canalith Repositioning Treatment Positive R Dix-Hallpike test for faint upbeating R torsional nystagmus with concurrent vertigo. Negative L Dix-Hallpike and negative bilateral Roll Test. Findings consistent with R posterior canal BPPV. Pt treated with 1 bout of Epley Maneuver for presumed R posterior canal BPPV. One minute holds in each position and retesting afterward which is negative for both vertigo and nystagmus.    Not performed: Supine R shoulder flexion from neutral to 90 degrees with 2# dumbbell (DB) 2 x 10; Supine R shoulder serratus punch at 90 flexion with 2# DB 2 x 10; Supine R shoulder circles clockwise/counterclockwise with 2# DB 2 x 10 each direction; Supine R shoulder AAROM with cane into flexion x 10; L sidelying R scapula upward rotation mobilizations, grade III, 30s/bout x 3 bouts; Prone R shoulder extension with 2# dumbbell (DB) 2 x 10; Prone R shoulder low row with 2# DB 2 x 10; Prone R shoulder horizontal abduction with 2# DB 2 x 10; Prone R shoulder low trap flexion with 2# DB 2 x 10; Prone R shoulder ER at 90 abduction with 2# DB 2  x 10; Seated R shoulder flexion to 90 degrees with 2# DB 2 x 10; Seated R shoulder scaption to 90 degrees with 2# DB 2 x 10; Standing shoulder IR AAROM with cane behind back x 10;   PATIENT EDUCATION:  Education details: Pt educated throughout session about proper posture and technique with exercises. Improved exercise technique, movement at target joints, use of target  muscles after min to mod verbal, visual, tactile cues, TDN, BPPV Person educated: Patient Education method: Explanation Education comprehension: verbalized understanding and returned demonstration   HOME EXERCISE PROGRAM: Access Code: [email or web address] URL: [email or web address] Date: 06/06/2022 Prepared by: Roxana Hires  Exercises - Seated Isometric Shoulder Inferior Glide  - 1 x daily - 7 x weekly - 2 sets - 10 reps - 3s hold - Dynamic Hug with Resistance  - 1 x daily - 7 x weekly - 2 sets - 10 reps - 3s hold - Standing Low Shoulder Row with Anchored Resistance  - 1 x daily - 7 x weekly - 2 sets - 10 reps - 3s hold - Standing Shoulder Internal Rotation Stretch with Towel  - 2 x daily - 7 x weekly - 3 reps - 30s hold - Seated Shoulder Flexion Table Top Stretch  - 2 x daily - 7 x weekly - 3 reps - 30s hold - Single Arm Doorway Pec Stretch at 90 Degrees Abduction  - 2 x daily - 7 x weekly - 3 reps - 30s hold  Patient Education - Shoulder Impingement   ASSESSMENT:  CLINICAL IMPRESSION: Patient demonstrates excellent motivation during session today. Continued manual techniques as well as R upper quarter strengthening with patient during appointment. Pt reports episodes of brief vertigo with positional changes which has been ongoing for months. Positive R Dix-Hallpike test for 10s of faint upbeating R torsional nystagmus with faint vertigo. Pt taken through [place] for presumed R posterior canal BPPV. Retesting is negative after maneuver. Will recheck at future sessions. Introduced trigger point dry needling during session however pt becomes diaphoretic and lightheaded during needling so terminated early and not repeated. Improved pain-free R shoulder flexion, abduction, and IR (HBB to around L1) at end of session. No HEP updates on this date and reinforced importance of home program to patient. Pt encouraged to follow-up as scheduled. Patient will benefit from skilled PT to  address above impairments and improve overall function.  REHAB POTENTIAL: Excellent  CLINICAL DECISION MAKING: Stable/uncomplicated  EVALUATION COMPLEXITY: Low   GOALS: Goals reviewed with patient? Yes  SHORT TERM GOALS: Target date: 06/30/2022  Pt will be independent with HEP to improve strength and decrease neck pain to improve pain-free function at home and work. Baseline:  Goal status: INITIAL   LONG TERM GOALS: Target date: 07/28/2022  Pt will increase FOTO to at least 57 to demonstrate significant improvement in function at home and work related to neck pain  Baseline: 06/02/22: 39 Goal status: INITIAL  2.  Pt will decrease worst shoulder pain by at least 3 points on the NPRS in order to demonstrate clinically significant reduction in shoulder pain. Baseline: 06/02/22: worst: 6/10; Goal status: INITIAL  3.  Pt will decrease quick DASH score by at least 8% in order to demonstrate clinically significant reduction in disability related to shoulder pain        Baseline: 06/02/22: To be completed; 06/06/22: 13.6% Goal status: INITIAL  4. Pt will increase strength of R low trap by at least 1/2 MMT grade in order to demonstrate improvement in  strength and function         Baseline: 06/02/22: 3+/5 Goal status: INITIAL  5. Pt will increase R shoulder flexion, abduction and IR AROM by at least 15 degrees each in order to restore normal shoulder motion to prevent pain with overhead and behind the back movements Baseline: 06/02/22: R shoulder: flexion: 150, Abduction: 135, IR: 45;  Goal status: INITIAL   PLAN: PT FREQUENCY: 1-2x/week  PT DURATION: 8 weeks  PLANNED INTERVENTIONS: Therapeutic exercises, Therapeutic activity, Neuromuscular re-education, Balance training, Gait training, Patient/Family education, Joint manipulation, Joint mobilization, Vestibular training, Canalith repositioning, Aquatic Therapy, Dry Needling, Electrical stimulation, Spinal manipulation, Spinal  mobilization, Cryotherapy, Moist heat, Traction, Ultrasound, Ionotophoresis '4mg'$ /ml Dexamethasone, and Manual therapy  PLAN FOR NEXT SESSION:  Repeat BPPV testing and provide handouts with education, progressive strengthening of R upper quarter with manual techniques to improve ROM  Lyndel Safe Oleta Gunnoe PT, DPT, GCS  Russie Gulledge 06/14/2022, 10:34 AM

## 2022-06-13 ENCOUNTER — Ambulatory Visit: Payer: 59

## 2022-06-13 DIAGNOSIS — G8929 Other chronic pain: Secondary | ICD-10-CM | POA: Diagnosis not present

## 2022-06-13 DIAGNOSIS — M25511 Pain in right shoulder: Secondary | ICD-10-CM | POA: Diagnosis not present

## 2022-06-13 DIAGNOSIS — R42 Dizziness and giddiness: Secondary | ICD-10-CM

## 2022-06-13 DIAGNOSIS — M6281 Muscle weakness (generalized): Secondary | ICD-10-CM

## 2022-06-14 ENCOUNTER — Other Ambulatory Visit: Payer: Self-pay

## 2022-06-14 NOTE — Therapy (Signed)
OUTPATIENT PHYSICAL THERAPY SHOULDER TREATMENT   Patient Name: Tonya Sampson MRN: [id number] DOB:1976-04-13, 46 y.o., female Today's Date: 06/16/2022   PT End of Session - 06/15/22 1703     Visit Number 5    Number of Visits 17    Date for PT Re-Evaluation 07/28/22    Authorization Type eval: 06/02/22    PT Start Time 1702    PT Stop Time 1745    PT Time Calculation (min) 43 min    Activity Tolerance Patient tolerated treatment well    Behavior During Therapy WFL for tasks assessed/performed              Past Medical History:  Diagnosis Date   Allergic rhinitis    Chickenpox    Claustrophobia    Diabetes ([place])    GERD (gastroesophageal reflux disease)    Hyperlipidemia associated with type 2 diabetes mellitus ([place])    Hypertension    Past Surgical History:  Procedure Laterality Date   DILITATION & CURRETTAGE/HYSTROSCOPY WITH NOVASURE ABLATION N/A 09/11/2019   Procedure: DILATATION & CURETTAGE/HYSTEROSCOPY W ABLATION;  Surgeon: Gae Dry, MD;  Location: [facility];  Service: Gynecology;  Laterality: N/A;   FOOT SURGERY  2010   nodule removed from right foot   LUMBAR DISC SURGERY  2008   discectomy ([facility])   Patient Active Problem List   Diagnosis Date Noted   S/P endometrial ablation 09/25/2019   Menorrhagia 07/08/2019   Hyperlipidemia associated with type 2 diabetes mellitus ([facility])    GERD (gastroesophageal reflux disease)    Hypertension    Viral upper respiratory infection 12/20/2015   Diabetes ([place]) 12/20/2015   Benign nevus 12/20/2015   Abnormal mammogram 12/20/2015    PCP: Tracie Harrier, MD  REFERRING PROVIDER: Corky Mull, MD  REFERRING DIAGNOSIS: M25.511 (ICD-10-CM) - Pain in right shoulder G89.29 (ICD-10-CM) - Other chronic pain M75.81 (ICD-10-CM) - Other shoulder lesions, right shoulder   THERAPY DIAG: Chronic right shoulder pain  Muscle weakness (generalized)  Dizziness and giddiness  RATIONALE FOR EVALUATION AND TREATMENT:  Rehabilitation  ONSET DATE: 06/02/2018 (approximate, symptoms started 4 years ago)  FOLLOW UP APPT WITH PROVIDER: Yes    SUBJECTIVE:                                                                                                                                                                                         Chief Complaint: R shoulder pain  Pertinent History Pt reports chronic R shoulder pain which first started approximately 4 years ago and developed without any specific cause or injury. She reports that the pain flairs occasionally and the symptoms have worsened  over the last few months. The pain is localized to the anterior shoulder. She tried taking meloxicam regularly for a couple weeks without any notable improvement. At one point she was exercising at the gym three times/week however now working out in the gym irritates her shoulder. She also gets pain if she reaches behind her back, reaches overhead, or rolls onto her R side while sleeping (wakes her up). She denies any neck pain, numbness/tingling, or focal weakness in RUE. She is R hand dominant. Pt saw a chiropractor for 6 visits without improvement. She had an appointment with Dr. Roland Rack who referred her for physical therapy for R rotator cuff tendinitis. She reports that he offered a steroid injection however she is a diabetic is concerned about an increase in her blood sugar. She had plain films of her R shoulder which demonstrated no evidence for fractures, lytic lesions, or significant degenerative changes. The subacromial space is well-maintained. There is no subacromial or infra-clavicular spurring. She demonstrates a Type I acromion.   Pain:  Pain Intensity: Present: 0/10, Best: 0/10, Worst: 6/10 Pain location: Anterior and anterolateral R shoulder Pain Quality:  pressure, deep ache   Radiating: No  Numbness/Tingling: No Focal Weakness: No Aggravating factors: reaching behind the back, overhead motion pain with wall  push-up, rolling onto R side;  Relieving factors: stop the activity, ibuprofen 24-hour pain behavior: no change throughout the day, activity dependent History of prior shoulder or neck/shoulder injury, pain, surgery, or therapy: No, she did see a chiropractor for 6 visits without improvement Falls: Has patient fallen in last 6 months? 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  Recent memory is intact.  Remote memory is intact.  Attention span and concentration are intact.  Expressive speech is intact.  Patient's fund of knowledge is within normal limits for educational level.    Gross Musculoskeletal Assessment Tremor: None Bulk: Normal Tone: Normal No edema, ecchymosis, or erythema noted around R shoulder;  Gait Deferred  Posture Forward head and rounded shoulders in sitting position. Partially able to correct with cues. In supine both shoulder protract anteriorly  Cervical Screen AROM: WFL and painless with overpressure in all planes Spurlings A (ipsilateral lateral flexion/axial compression): R: Negative L: Negative Spurlings B (ipsilateral lateral flexion/contralateral rotation/axial compression): R: Negative L: Negative Repeated movement: No centralization or peripheralization with repeated  retraction Hoffman Sign (cervical cord compression): R: Not tested L: Not tested ULTT Median: R: Not examined L: Not examined ULTT Ulnar: R: Not examined L: Not examined ULTT Radial: R: Not examined L: Not examined   AROM  AROM (Normal range in degrees) AROM 06/16/2022  Cervical  Flexion (50) WNL  Extension (80) WNL  Right lateral flexion (45) WNL  Left lateral flexion (45) WNL  Right rotation (85) WNL  Left rotation (85) WNL   Right Left  Shoulder    Flexion 150 (Pain starts at 115) 170  Extension    Abduction 135 175  External Rotation 72 85  Internal Rotation 45 70  Hands Behind Head T3 T3  Hands Behind Back Belt line T5      Elbow    Flexion WNL WNL  Extension WNL WNL  Pronation WNL WNL  Supination WNL WNL  (* = pain; Blank rows = not tested)    LE MMT:  MMT (out of 5) Right 06/16/2022 Left 06/16/2022  Cervical (isometric)  Flexion WNL  Extension WNL  Lateral Flexion WNL WNL  Rotation WNL WNL      Shoulder   Flexion 5* 5  Extension 5 5  Abduction 5* 5  External rotation 5 5  Internal rotation 5 5  Horizontal abduction 4+ 4+  Horizontal adduction    Lower Trapezius 3+* 3+  Rhomboids 5 5      Elbow  Flexion 5 5  Extension 5 5  Pronation    Supination        Wrist  Flexion 5 5  Extension 5 5  Radial deviation    Ulnar deviation        MCP  Flexion 5 5  Extension 5 5  Abduction 5 5  Adduction 5 5  (* = pain; Blank rows = not tested)  Sensation Grossly intact to light touch bilateral UE as determined by testing dermatomes C2-T2. Proprioception and hot/cold testing deferred on this date.  Reflexes Deferred  Palpation  Location LEFT  RIGHT           Subocciptials  0  Cervical paraspinals  0  Upper Trapezius  0  Levator Scapulae  0  Rhomboid Major/Minor    Sternoclavicular joint    Acromioclavicular joint  0  Coracoid process  0  Long head of biceps  1  Supraspinatus  0  Infraspinatus  0  Subscapularis  0  Teres Minor  0   Teres Major  0  Pectoralis Major  0  Pectoralis Minor    Anterior Deltoid  0  Lateral Deltoid  0  Posterior Deltoid  0  Latissimus Dorsi  0  Sternocleidomastoid    (Blank rows = not tested) Graded on 0-4 scale (0 = no pain, 1 = pain, 2 = pain with wincing/grimacing/flinching, 3 = pain with withdrawal, 4 = unwilling to allow palpation), (Blank rows = not tested)   Repeated Movements No centralization or peripheralization of symptoms with repeated cervical retraction.   Passive Accessory Intervertebral Motion Pt denies reproduction of shoulder pain with CPA C2-T7 and UPA bilaterally C2-T7. Mild tenderness centrally in cervical spine with pressure but no R shoulder pain. Grossly hypomobile throughout thoracic spine;  Accessory Motions/Glides Glenohumeral: Posterior: R: abnormal L: normal Inferior: R: normal L: normal Anterior: R: normal L: normal  Acromioclavicular:  Posterior: R: normal L: normal Anterior: R: normal L: normal  Sternoclavicular: Posterior: R: normal L: normal Anterior: R: normal L: normal Superior: R: normal L: normal Inferior: R: normal L: normal  Scapulothoracic: Not examined  Muscle Length Testing Not examined   SPECIAL TESTS Rotator Cuff  Drop Arm Test: Negative Painful Arc (Pain from 60 to 120 degrees scaption): Negative Infraspinatus Muscle Test: Negative  Subacromial Impingement Hawkins-Kennedy: Positive Neer (Block scapula, PROM flexion): Positive Painful Arc (Pain from 60 to 120 degrees scaption): Negative (pt has pain at 115 to end range) Empty Can: Negative External Rotation Resistance: Negative Horizontal Adduction: Not examined Scapular Assist: Positive for decrease in pain  Labral Tear Biceps Load II (120 elevation, full ER,  90 elbow flexion, full supination, resisted elbow flexion): Negative Crank (160 scaption, axial load with IR/ER): Negative Active Compression Test: Not examined  Bicep Tendon Pathology Speed (shoulder  flexion to 90, external rotation, full elbow extension, and forearm supination with resistance: Negative Yergason's (resisted shoulder ER and supination/biceps tendon pathology): Negative  Shoulder Instability Sulcus Sign: Negative Anterior Apprehension: Negative  Beighton scale: Deferred    TODAY'S TREATMENT    SUBJECTIVE: Pt reports that she is doing well today. No significant changes since the last therapy session. 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 She denies any resting R shoulder pain upon arrival today. No further episodes of vertigo/dizziness. No specific questions or concerns.   PAIN: Denies   Ther-ex  Seated pulleys for R shoulder flexion, abduction, and IR (standing) x multiple bouts each; Supine R shoulder flexion from neutral to 90 degrees with 3# dumbbell (DB) 2 x 10; Supine R shoulder serratus punch at 90 flexion with 3# DB 2 x 10; Supine R shoulder circles clockwise/counterclockwise with 3# DB 2 x 10 each direction; Supine rhythmic stabilization at 90 degrees x 30s; L sidelying R shoulder abduction from neutral to 90 abduction with 3# DB 2 x 10; L sidelying R shoulder ER with elbow at side with 3# DB 2 x 10; Standing wall slides with lift off and red tband around wrists 2 x 10; HEP updated and reviewed, pulleys provided, education added to HEP regarding BPPV;   Manual Therapy  Supine R GH distraction progressing through passive abduction ROM; Supine R GH AP mobilizations at neutral, grade II-III (pain limited) 30s/bout x 3 bouts; Supine R GH inferior mobilizations at 90 abduction, grade II-III (pain limited) 30s/bout x 3 bouts; Supine R GH AP mobilizations at 90 abduction and available end range ER, grade II-III (pain limited) 30s/bout x 3 bouts; Supine R GH AP mobilizations at 90 flexion through long axis, grade II-III, 30s/bout x 3 bouts;   Not  performed: L sidelying R scapula upward rotation mobilizations, grade III, 30s/bout x 3 bouts; Prone R shoulder extension with 2# dumbbell (DB) 2 x 10; Prone R shoulder low row with 2# DB 2 x 10; Prone R shoulder horizontal abduction with 2# DB 2 x 10; Prone R shoulder low trap flexion with 2# DB 2 x 10; Prone R shoulder ER at 90 abduction with 2# DB 2 x 10; Seated R shoulder flexion to 90 degrees with 2# DB 2 x 10; Seated R shoulder scaption to 90 degrees with 2# DB 2 x 10; Standing shoulder IR AAROM with cane behind back x 10; L sidelying R supraspinatus, infraspinatus, and teres minor aggressive STM;   PATIENT EDUCATION:  Education details: Pt educated throughout session about proper posture and technique with exercises. Improved exercise technique, movement at target joints, use of target muscles after min to mod verbal, visual, tactile cues, BPPV Person educated: Patient Education method: Explanation, verbal cues, tactile cues, and handout Education comprehension: verbalized understanding and returned demonstration   HOME EXERCISE PROGRAM: Access Code: [email or web address] URL: [email or web address] Date: 06/15/2022 Prepared by: Roxana Hires  Exercises - Seated Isometric Shoulder Inferior Glide  - 1 x daily - 7 x weekly - 2 sets - 10 reps - 3s hold - Dynamic Hug with Resistance  - 1 x daily - 7 x weekly - 2 sets - 10 reps - 3s hold - Standing Low Shoulder Row with Anchored Resistance  -  1 x daily - 7 x weekly - 2 sets - 10 reps - 3s hold - Standing Shoulder Internal Rotation Stretch with Towel  - 2 x daily - 7 x weekly - 3 reps - 30s hold - Seated Shoulder Flexion Table Top Stretch  - 2 x daily - 7 x weekly - 3 reps - 30s hold - Single Arm Doorway Pec Stretch at 90 Degrees Abduction  - 2 x daily - 7 x weekly - 3 reps - 30s hold - Seated Shoulder Flexion AAROM with Pulley Behind (Mirrored)  - 2 x daily - 7 x weekly - 2 sets - 10 reps - Seated Shoulder Scaption AAROM with  Pulley at Side (Mirrored)  - 2 x daily - 7 x weekly - 2 sets - 10 reps - Seated Shoulder Internal Rotation AAROM with Pulley  - 2 x daily - 7 x weekly - 2 sets - 10 reps - Self-Epley Maneuver Right Ear  - 1 x daily - 7 x weekly - hold for 60s in each position hold  Patient Education - Shoulder Impingement - BPPV  ASSESSMENT:  CLINICAL IMPRESSION: Patient demonstrates excellent motivation during session today. Continued manual techniques as well as R upper quarter strengthening with patient during appointment. Introduced pulley exercises for R shoulder AAROM and pt is demonstrating improving R shoulder ROM during session. She feels like the pulleys are helpful so issued pulley AAROM for HEP.  Pt has had no additional episodes of vertigo since last session. Therapist added BPPV education to HEP. Reinforced importance of home program to patient. She is making excellent progress with less frequent shoulder pain and improving range of motion. Pt encouraged to follow-up as scheduled. Patient will benefit from skilled PT to address above impairments and improve overall function.  REHAB POTENTIAL: Excellent  CLINICAL DECISION MAKING: Stable/uncomplicated  EVALUATION COMPLEXITY: Low   GOALS: Goals reviewed with patient? Yes  SHORT TERM GOALS: Target date: 06/30/2022  Pt will be independent with HEP to improve strength and decrease neck pain to improve pain-free function at home and work. Baseline:  Goal status: INITIAL   LONG TERM GOALS: Target date: 07/28/2022  Pt will increase FOTO to at least 57 to demonstrate significant improvement in function at home and work related to neck pain  Baseline: 06/02/22: 39 Goal status: INITIAL  2.  Pt will decrease worst shoulder pain by at least 3 points on the NPRS in order to demonstrate clinically significant reduction in shoulder pain. Baseline: 06/02/22: worst: 6/10; Goal status: INITIAL  3.  Pt will decrease quick DASH score by at least 8% in  order to demonstrate clinically significant reduction in disability related to shoulder pain        Baseline: 06/02/22: To be completed; 06/06/22: 13.6% Goal status: INITIAL  4. Pt will increase strength of R low trap by at least 1/2 MMT grade in order to demonstrate improvement in strength and function         Baseline: 06/02/22: 3+/5 Goal status: INITIAL  5. Pt will increase R shoulder flexion, abduction and IR AROM by at least 15 degrees each in order to restore normal shoulder motion to prevent pain with overhead and behind the back movements Baseline: 06/02/22: R shoulder: flexion: 150, Abduction: 135, IR: 45;  Goal status: INITIAL   PLAN: PT FREQUENCY: 1-2x/week  PT DURATION: 8 weeks  PLANNED INTERVENTIONS: Therapeutic exercises, Therapeutic activity, Neuromuscular re-education, Balance training, Gait training, Patient/Family education, Joint manipulation, Joint mobilization, Vestibular training, Canalith repositioning, Aquatic Therapy,  Dry Needling, Electrical stimulation, Spinal manipulation, Spinal mobilization, Cryotherapy, Moist heat, Traction, Ultrasound, Ionotophoresis '4mg'$ /ml Dexamethasone, and Manual therapy  PLAN FOR NEXT SESSION:  progressive strengthening of R upper quarter with manual techniques to improve ROM  Keijuan Schellhase D Whyatt Klinger PT, DPT, GCS  Deshanae Lindo 06/16/2022, 10:01 AM

## 2022-06-15 ENCOUNTER — Ambulatory Visit: Payer: 59

## 2022-06-15 DIAGNOSIS — M6281 Muscle weakness (generalized): Secondary | ICD-10-CM | POA: Diagnosis not present

## 2022-06-15 DIAGNOSIS — M25511 Pain in right shoulder: Secondary | ICD-10-CM | POA: Diagnosis not present

## 2022-06-15 DIAGNOSIS — G8929 Other chronic pain: Secondary | ICD-10-CM

## 2022-06-15 DIAGNOSIS — R42 Dizziness and giddiness: Secondary | ICD-10-CM | POA: Diagnosis not present

## 2022-06-16 ENCOUNTER — Other Ambulatory Visit: Payer: Self-pay

## 2022-06-20 ENCOUNTER — Ambulatory Visit: Payer: 59

## 2022-06-20 DIAGNOSIS — M6281 Muscle weakness (generalized): Secondary | ICD-10-CM

## 2022-06-20 DIAGNOSIS — G8929 Other chronic pain: Secondary | ICD-10-CM

## 2022-06-21 NOTE — Therapy (Signed)
OUTPATIENT PHYSICAL THERAPY SHOULDER TREATMENT   Patient Name: Tonya Sampson MRN: [id number] DOB:01-Feb-1976, 46 y.o., female Today's Date: 06/22/2022   PT End of Session - 06/22/22 0928     Visit Number 6    Number of Visits 17    Date for PT Re-Evaluation 07/28/22    Authorization Type eval: 06/02/22    PT Start Time 0930    PT Stop Time 2229    PT Time Calculation (min) 45 min    Activity Tolerance Patient tolerated treatment well    Behavior During Therapy [facility] for tasks assessed/performed               Past Medical History:  Diagnosis Date   Allergic rhinitis    Chickenpox    Claustrophobia    Diabetes ([place])    GERD (gastroesophageal reflux disease)    Hyperlipidemia associated with type 2 diabetes mellitus ([place])    Hypertension    Past Surgical History:  Procedure Laterality Date   DILITATION & CURRETTAGE/HYSTROSCOPY WITH NOVASURE ABLATION N/A 09/11/2019   Procedure: DILATATION & CURETTAGE/HYSTEROSCOPY W ABLATION;  Surgeon: Gae Dry, MD;  Location: [facility] ORS;  Service: Gynecology;  Laterality: N/A;   FOOT SURGERY  2010   nodule removed from right foot   LUMBAR DISC SURGERY  2008   discectomy ([facility])   Patient Active Problem List   Diagnosis Date Noted   S/P endometrial ablation 09/25/2019   Menorrhagia 07/08/2019   Hyperlipidemia associated with type 2 diabetes mellitus ([facility])    GERD (gastroesophageal reflux disease)    Hypertension    Viral upper respiratory infection 12/20/2015   Diabetes ([place]) 12/20/2015   Benign nevus 12/20/2015   Abnormal mammogram 12/20/2015    PCP: Tracie Harrier, MD  REFERRING PROVIDER: Corky Mull, MD  REFERRING DIAGNOSIS: M25.511 (ICD-10-CM) - Pain in right shoulder G89.29 (ICD-10-CM) - Other chronic pain M75.81 (ICD-10-CM) - Other shoulder lesions, right shoulder   THERAPY DIAG: Chronic right shoulder pain  Muscle weakness (generalized)  RATIONALE FOR EVALUATION AND TREATMENT: Rehabilitation  ONSET DATE:  06/02/2018 (approximate, symptoms started 4 years ago)  FOLLOW UP APPT WITH PROVIDER: Yes    SUBJECTIVE:                                                                                                                                                                                         Chief Complaint: R shoulder pain  Pertinent History Pt reports chronic R shoulder pain which first started approximately 4 years ago and developed without any specific cause or injury. She reports that the pain flairs occasionally and the symptoms have worsened over the last  few months. The pain is localized to the anterior shoulder. She tried taking meloxicam regularly for a couple weeks without any notable improvement. At one point she was exercising at the gym three times/week however now working out in the gym irritates her shoulder. She also gets pain if she reaches behind her back, reaches overhead, or rolls onto her R side while sleeping (wakes her up). She denies any neck pain, numbness/tingling, or focal weakness in RUE. She is R hand dominant. Pt saw a chiropractor for 6 visits without improvement. She had an appointment with Dr. Roland Rack who referred her for physical therapy for R rotator cuff tendinitis. She reports that he offered a steroid injection however she is a diabetic is concerned about an increase in her blood sugar. She had plain films of her R shoulder which demonstrated no evidence for fractures, lytic lesions, or significant degenerative changes. The subacromial space is well-maintained. There is no subacromial or infra-clavicular spurring. She demonstrates a Type I acromion.   Pain:  Pain Intensity: Present: 0/10, Best: 0/10, Worst: 6/10 Pain location: Anterior and anterolateral R shoulder Pain Quality:  pressure, deep ache   Radiating: No  Numbness/Tingling: No Focal Weakness: No Aggravating factors: reaching behind the back, overhead motion pain with wall push-up, rolling onto R side;   Relieving factors: stop the activity, ibuprofen 24-hour pain behavior: no change throughout the day, activity dependent History of prior shoulder or neck/shoulder injury, pain, surgery, or therapy: No, she did see a chiropractor for 6 visits without improvement Falls: Has patient fallen in last 6 months? 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  Recent memory is intact.  Remote memory is intact.  Attention span and concentration are intact.  Expressive speech is intact.  Patient's fund of knowledge is within normal limits for educational level.    Gross Musculoskeletal Assessment Tremor: None Bulk: Normal Tone: Normal No edema, ecchymosis, or erythema noted around R shoulder;  Gait Deferred  Posture Forward head and rounded shoulders in sitting position. Partially able to correct with cues. In supine both shoulder protract anteriorly  Cervical Screen AROM: WFL and painless with overpressure in all planes Spurlings A (ipsilateral lateral flexion/axial compression): R: Negative L: Negative Spurlings B (ipsilateral lateral flexion/contralateral rotation/axial compression): R: Negative L: Negative Repeated movement: No centralization or peripheralization with repeated retraction Hoffman Sign (cervical  cord compression): R: Not tested L: Not tested ULTT Median: R: Not examined L: Not examined ULTT Ulnar: R: Not examined L: Not examined ULTT Radial: R: Not examined L: Not examined   AROM  AROM (Normal range in degrees) AROM 06/22/2022  Cervical  Flexion (50) WNL  Extension (80) WNL  Right lateral flexion (45) WNL  Left lateral flexion (45) WNL  Right rotation (85) WNL  Left rotation (85) WNL   Right Left  Shoulder    Flexion 150 (Pain starts at 115) 170  Extension  Abduction 135 175  External Rotation 72 85  Internal Rotation 45 70  Hands Behind Head T3 T3  Hands Behind Back Belt line T5      Elbow    Flexion WNL WNL  Extension WNL WNL  Pronation WNL WNL  Supination WNL WNL  (* = pain; Blank rows = not tested)    LE MMT:  MMT (out of 5) Right 06/22/2022 Left 06/22/2022  Cervical (isometric)  Flexion WNL  Extension WNL  Lateral Flexion WNL WNL  Rotation WNL WNL      Shoulder   Flexion 5* 5  Extension 5 5  Abduction 5* 5  External rotation 5 5  Internal rotation 5 5  Horizontal abduction 4+ 4+  Horizontal adduction    Lower Trapezius 3+* 3+  Rhomboids 5 5      Elbow  Flexion 5 5  Extension 5 5  Pronation    Supination        Wrist  Flexion 5 5  Extension 5 5  Radial deviation    Ulnar deviation        MCP  Flexion 5 5  Extension 5 5  Abduction 5 5  Adduction 5 5  (* = pain; Blank rows = not tested)  Sensation Grossly intact to light touch bilateral UE as determined by testing dermatomes C2-T2. Proprioception and hot/cold testing deferred on this date.  Reflexes Deferred  Palpation  Location LEFT  RIGHT           Subocciptials  0  Cervical paraspinals  0  Upper Trapezius  0  Levator Scapulae  0  Rhomboid Major/Minor    Sternoclavicular joint    Acromioclavicular joint  0  Coracoid process  0  Long head of biceps  1  Supraspinatus  0  Infraspinatus  0  Subscapularis  0  Teres Minor  0  Teres Major  0  Pectoralis Major  0   Pectoralis Minor    Anterior Deltoid  0  Lateral Deltoid  0  Posterior Deltoid  0  Latissimus Dorsi  0  Sternocleidomastoid    (Blank rows = not tested) Graded on 0-4 scale (0 = no pain, 1 = pain, 2 = pain with wincing/grimacing/flinching, 3 = pain with withdrawal, 4 = unwilling to allow palpation), (Blank rows = not tested)   Repeated Movements No centralization or peripheralization of symptoms with repeated cervical retraction.   Passive Accessory Intervertebral Motion Pt denies reproduction of shoulder pain with CPA C2-T7 and UPA bilaterally C2-T7. Mild tenderness centrally in cervical spine with pressure but no R shoulder pain. Grossly hypomobile throughout thoracic spine;  Accessory Motions/Glides Glenohumeral: Posterior: R: abnormal L: normal Inferior: R: normal L: normal Anterior: R: normal L: normal  Acromioclavicular:  Posterior: R: normal L: normal Anterior: R: normal L: normal  Sternoclavicular: Posterior: R: normal L: normal Anterior: R: normal L: normal Superior: R: normal L: normal Inferior: R: normal L: normal  Scapulothoracic: Not examined  Muscle Length Testing Not examined   SPECIAL TESTS Rotator Cuff  Drop Arm Test: Negative Painful Arc (Pain from 60 to 120 degrees scaption): Negative Infraspinatus Muscle Test: Negative  Subacromial Impingement Hawkins-Kennedy: Positive Neer (Block scapula, PROM flexion): Positive Painful Arc (Pain from 60 to 120 degrees scaption): Negative (pt has pain at 115 to end range) Empty Can: Negative External Rotation Resistance: Negative Horizontal Adduction: Not examined Scapular Assist: Positive for decrease in pain  Labral Tear Biceps Load II (120 elevation, full ER, 90 elbow flexion, full  supination, resisted elbow flexion): Negative Crank (160 scaption, axial load with IR/ER): Negative Active Compression Test: Not examined  Bicep Tendon Pathology Speed (shoulder flexion to 90, external rotation, full  elbow extension, and forearm supination with resistance: Negative Yergason's (resisted shoulder ER and supination/biceps tendon pathology): Negative  Shoulder Instability Sulcus Sign: Negative Anterior Apprehension: Negative  Beighton scale: Deferred    TODAY'S TREATMENT    SUBJECTIVE: Pt reports that she is doing well today. No significant changes since the last therapy session. She denies any resting R shoulder pain upon arrival today. No further episodes of vertigo/dizziness. No specific questions or concerns.   PAIN: Denies   Ther-ex  UBE x 4 minutes for warm-up during interval history (2 minutes forward/2 minutes backward) Seated pulleys for R shoulder flexion, abduction, and IR (standing) x multiple bouts each; Seated R shoulder flexion to 90 degrees with 2# DB 2 x 10 (attempted 3# but increase in R shoulder pain); Seated R shoulder scaption to 90 degrees with 2# DB 2 x 10; Seated Nautilus lat pull down 60# 2 x 10; Seated Nautilus rows 40# 2 x 10;   Manual Therapy  Supine R GH distraction progressing through passive abduction ROM; Supine R GH AP mobilizations at neutral, grade II-III (pain limited) 30s/bout x 3 bouts; Supine R GH inferior mobilizations at 90 abduction, grade II-III (pain limited) 30s/bout x 3 bouts; Supine R GH AP mobilizations at 90 abduction and available end range ER, grade II-III (pain limited) 30s/bout x 3 bouts; Supine R GH AP mobilizations at 90 flexion through long axis, grade II-III, 30s/bout x 3 bouts; L sidelying R scapula upward rotation mobilizations, grade III, 30s/bout x 3 bouts;  R shoulder AAROM measurements after manual therapy:  Flexion: 163 (154) Abduction: 141 (135) ER: 75 (70) IR: 63 (64)   Not performed: Supine R shoulder flexion from neutral to 90 degrees with 3# dumbbell (DB) 2 x 10; Supine R shoulder serratus punch at 90 flexion with 3# DB 2 x 10; Supine R shoulder circles clockwise/counterclockwise with 3# DB 2 x 10 each  direction; Supine rhythmic stabilization at 90 degrees x 30s;Prone R shoulder extension with 2# dumbbell (DB) 2 x 10; L sidelying R supraspinatus, infraspinatus, and teres minor aggressive STM; L sidelying R shoulder abduction from neutral to 90 abduction with 3# DB 2 x 10; L sidelying R shoulder ER with elbow at side with 3# DB 2 x 10; Prone R shoulder low row with 2# DB 2 x 10; Prone R shoulder horizontal abduction with 2# DB 2 x 10; Prone R shoulder low trap flexion with 2# DB 2 x 10; Prone R shoulder ER at 90 abduction with 2# DB 2 x 10; Standing shoulder IR AAROM with cane behind back x 10; Standing wall slides with lift off and red tband around wrists 2 x 10;   PATIENT EDUCATION:  Education details: Pt educated throughout session about proper posture and technique with exercises. Improved exercise technique, movement at target joints, use of target muscles after min to mod verbal, visual, tactile cues, BPPV Person educated: Patient Education method: Explanation, verbal cues, tactile cues Education comprehension: verbalized understanding and returned demonstration   HOME EXERCISE PROGRAM: Access Code: [email or web address] URL: [email or web address] Date: 06/15/2022 Prepared by: Tonya Sampson  Exercises - Seated Isometric Shoulder Inferior Glide  - 1 x daily - 7 x weekly - 2 sets - 10 reps - 3s hold - Dynamic Hug with Resistance  - 1 x daily - 7 x weekly - 2  sets - 10 reps - 3s hold - Standing Low Shoulder Row with Anchored Resistance  - 1 x daily - 7 x weekly - 2 sets - 10 reps - 3s hold - Standing Shoulder Internal Rotation Stretch with Towel  - 2 x daily - 7 x weekly - 3 reps - 30s hold - Seated Shoulder Flexion Table Top Stretch  - 2 x daily - 7 x weekly - 3 reps - 30s hold - Single Arm Doorway Pec Stretch at 90 Degrees Abduction  - 2 x daily - 7 x weekly - 3 reps - 30s hold - Seated Shoulder Flexion AAROM with Pulley Behind (Mirrored)  - 2 x daily - 7 x weekly - 2 sets -  10 reps - Seated Shoulder Scaption AAROM with Pulley at Side (Mirrored)  - 2 x daily - 7 x weekly - 2 sets - 10 reps - Seated Shoulder Internal Rotation AAROM with Pulley  - 2 x daily - 7 x weekly - 2 sets - 10 reps - Self-Epley Maneuver Right Ear  - 1 x daily - 7 x weekly - hold for 60s in each position hold  Patient Education - Shoulder Impingement - BPPV  ASSESSMENT:  CLINICAL IMPRESSION: Patient demonstrates excellent motivation during session today. Continued manual techniques as well as R upper quarter strengthening with patient during appointment. Also continued to utilize pulleys for R shoulder AAROM. After manual therapy measurements obtained and pt demonstrates improvement in R shoulder flexion, abduction, and ER AROM compared to last measurements. She is making excellent progress with less frequent shoulder pain and improving range of motion. Pt encouraged to follow-up as scheduled. Patient will benefit from skilled PT to address above impairments and improve overall function.  REHAB POTENTIAL: Excellent  CLINICAL DECISION MAKING: Stable/uncomplicated  EVALUATION COMPLEXITY: Low   GOALS: Goals reviewed with patient? Yes  SHORT TERM GOALS: Target date: 06/30/2022  Pt will be independent with HEP to improve strength and decrease neck pain to improve pain-free function at home and work. Baseline:  Goal status: INITIAL   LONG TERM GOALS: Target date: 07/28/2022  Pt will increase FOTO to at least 57 to demonstrate significant improvement in function at home and work related to neck pain  Baseline: 06/02/22: 39 Goal status: INITIAL  2.  Pt will decrease worst shoulder pain by at least 3 points on the NPRS in order to demonstrate clinically significant reduction in shoulder pain. Baseline: 06/02/22: worst: 6/10; Goal status: INITIAL  3.  Pt will decrease quick DASH score by at least 8% in order to demonstrate clinically significant reduction in disability related to shoulder  pain        Baseline: 06/02/22: To be completed; 06/06/22: 13.6% Goal status: INITIAL  4. Pt will increase strength of R low trap by at least 1/2 MMT grade in order to demonstrate improvement in strength and function         Baseline: 06/02/22: 3+/5 Goal status: INITIAL  5. Pt will increase R shoulder flexion, abduction and IR AROM by at least 15 degrees each in order to restore normal shoulder motion to prevent pain with overhead and behind the back movements Baseline: 06/02/22: R shoulder: flexion: 150, Abduction: 135, IR: 45;  Goal status: INITIAL   PLAN: PT FREQUENCY: 1-2x/week  PT DURATION: 8 weeks  PLANNED INTERVENTIONS: Therapeutic exercises, Therapeutic activity, Neuromuscular re-education, Balance training, Gait training, Patient/Family education, Joint manipulation, Joint mobilization, Vestibular training, Canalith repositioning, Aquatic Therapy, Dry Needling, Electrical stimulation, Spinal manipulation, Spinal mobilization,  Cryotherapy, Moist heat, Traction, Ultrasound, Ionotophoresis '4mg'$ /ml Dexamethasone, and Manual therapy  PLAN FOR NEXT SESSION:  progressive strengthening of R upper quarter with manual techniques to improve ROM  Tonya Sampson PT, DPT, GCS  Tonya Sampson 06/22/2022, 1:40 PM

## 2022-06-22 ENCOUNTER — Ambulatory Visit: Payer: 59

## 2022-06-22 DIAGNOSIS — G8929 Other chronic pain: Secondary | ICD-10-CM | POA: Diagnosis not present

## 2022-06-22 DIAGNOSIS — M6281 Muscle weakness (generalized): Secondary | ICD-10-CM

## 2022-06-22 DIAGNOSIS — R42 Dizziness and giddiness: Secondary | ICD-10-CM | POA: Diagnosis not present

## 2022-06-22 DIAGNOSIS — M25511 Pain in right shoulder: Secondary | ICD-10-CM | POA: Diagnosis not present

## 2022-06-27 ENCOUNTER — Ambulatory Visit: Payer: 59

## 2022-06-27 DIAGNOSIS — M25511 Pain in right shoulder: Secondary | ICD-10-CM | POA: Diagnosis not present

## 2022-06-27 DIAGNOSIS — G8929 Other chronic pain: Secondary | ICD-10-CM

## 2022-06-27 DIAGNOSIS — M6281 Muscle weakness (generalized): Secondary | ICD-10-CM

## 2022-06-27 DIAGNOSIS — R42 Dizziness and giddiness: Secondary | ICD-10-CM | POA: Diagnosis not present

## 2022-06-27 NOTE — Therapy (Signed)
OUTPATIENT PHYSICAL THERAPY SHOULDER TREATMENT   Patient Name: Tonya Sampson MRN: [id number] DOB:05/28/1976, 46 y.o., female Today's Date: 06/28/2022   PT End of Session - 06/27/22 1653     Visit Number 7    Number of Visits 17    Date for PT Re-Evaluation 07/28/22    Authorization Type eval: 06/02/22    PT Start Time 1700    PT Stop Time 1745    PT Time Calculation (min) 45 min    Activity Tolerance Patient tolerated treatment well    Behavior During Therapy [facility] for tasks assessed/performed             Past Medical History:  Diagnosis Date   Allergic rhinitis    Chickenpox    Claustrophobia    Diabetes ([place])    GERD (gastroesophageal reflux disease)    Hyperlipidemia associated with type 2 diabetes mellitus ([place])    Hypertension    Past Surgical History:  Procedure Laterality Date   DILITATION & CURRETTAGE/HYSTROSCOPY WITH NOVASURE ABLATION N/A 09/11/2019   Procedure: DILATATION & CURETTAGE/HYSTEROSCOPY W ABLATION;  Surgeon: Gae Dry, MD;  Location: [facility] ORS;  Service: Gynecology;  Laterality: N/A;   FOOT SURGERY  2010   nodule removed from right foot   LUMBAR DISC SURGERY  2008   discectomy ([facility])   Patient Active Problem List   Diagnosis Date Noted   S/P endometrial ablation 09/25/2019   Menorrhagia 07/08/2019   Hyperlipidemia associated with type 2 diabetes mellitus ([facility])    GERD (gastroesophageal reflux disease)    Hypertension    Viral upper respiratory infection 12/20/2015   Diabetes ([place]) 12/20/2015   Benign nevus 12/20/2015   Abnormal mammogram 12/20/2015    PCP: Tracie Harrier, MD  REFERRING PROVIDER: Corky Mull, MD  REFERRING DIAGNOSIS: M25.511 (ICD-10-CM) - Pain in right shoulder G89.29 (ICD-10-CM) - Other chronic pain M75.81 (ICD-10-CM) - Other shoulder lesions, right shoulder   THERAPY DIAG: Chronic right shoulder pain  Muscle weakness (generalized)  RATIONALE FOR EVALUATION AND TREATMENT: Rehabilitation  ONSET DATE:  06/02/2018 (approximate, symptoms started 4 years ago)  FOLLOW UP APPT WITH PROVIDER: Yes    SUBJECTIVE:                                                                                                                                                                                         Chief Complaint: R shoulder pain  Pertinent History Pt reports chronic R shoulder pain which first started approximately 4 years ago and developed without any specific cause or injury. She reports that the pain flairs occasionally and the symptoms have worsened over the last few months.  The pain is localized to the anterior shoulder. She tried taking meloxicam regularly for a couple weeks without any notable improvement. At one point she was exercising at the gym three times/week however now working out in the gym irritates her shoulder. She also gets pain if she reaches behind her back, reaches overhead, or rolls onto her R side while sleeping (wakes her up). She denies any neck pain, numbness/tingling, or focal weakness in RUE. She is R hand dominant. Pt saw a chiropractor for 6 visits without improvement. She had an appointment with Dr. Roland Rack who referred her for physical therapy for R rotator cuff tendinitis. She reports that he offered a steroid injection however she is a diabetic is concerned about an increase in her blood sugar. She had plain films of her R shoulder which demonstrated no evidence for fractures, lytic lesions, or significant degenerative changes. The subacromial space is well-maintained. There is no subacromial or infra-clavicular spurring. She demonstrates a Type I acromion.   Pain:  Pain Intensity: Present: 0/10, Best: 0/10, Worst: 6/10 Pain location: Anterior and anterolateral R shoulder Pain Quality:  pressure, deep ache   Radiating: No  Numbness/Tingling: No Focal Weakness: No Aggravating factors: reaching behind the back, overhead motion pain with wall push-up, rolling onto R side;   Relieving factors: stop the activity, ibuprofen 24-hour pain behavior: no change throughout the day, activity dependent History of prior shoulder or neck/shoulder injury, pain, surgery, or therapy: No, she did see a chiropractor for 6 visits without improvement Falls: Has patient fallen in last 6 months? 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  Recent memory is intact.  Remote memory is intact.  Attention span and concentration are intact.  Expressive speech is intact.  Patient's fund of knowledge is within normal limits for educational level.    Gross Musculoskeletal Assessment Tremor: None Bulk: Normal Tone: Normal No edema, ecchymosis, or erythema noted around R shoulder;  Gait Deferred  Posture Forward head and rounded shoulders in sitting position. Partially able to correct with cues. In supine both shoulder protract anteriorly  Cervical Screen AROM: WFL and painless with overpressure in all planes Spurlings A (ipsilateral lateral flexion/axial compression): R: Negative L: Negative Spurlings B (ipsilateral lateral flexion/contralateral rotation/axial compression): R: Negative L: Negative Repeated movement: No centralization or peripheralization with repeated retraction Hoffman Sign (cervical  cord compression): R: Not tested L: Not tested ULTT Median: R: Not examined L: Not examined ULTT Ulnar: R: Not examined L: Not examined ULTT Radial: R: Not examined L: Not examined   AROM  AROM (Normal range in degrees) AROM 06/28/2022  Cervical  Flexion (50) WNL  Extension (80) WNL  Right lateral flexion (45) WNL  Left lateral flexion (45) WNL  Right rotation (85) WNL  Left rotation (85) WNL   Right Left  Shoulder    Flexion 150 (Pain starts at 115) 170  Extension    Abduction  135 175  External Rotation 72 85  Internal Rotation 45 70  Hands Behind Head T3 T3  Hands Behind Back Belt line T5      Elbow    Flexion WNL WNL  Extension WNL WNL  Pronation WNL WNL  Supination WNL WNL  (* = pain; Blank rows = not tested)    LE MMT:  MMT (out of 5) Right 06/28/2022 Left 06/28/2022  Cervical (isometric)  Flexion WNL  Extension WNL  Lateral Flexion WNL WNL  Rotation WNL WNL      Shoulder   Flexion 5* 5  Extension 5 5  Abduction 5* 5  External rotation 5 5  Internal rotation 5 5  Horizontal abduction 4+ 4+  Horizontal adduction    Lower Trapezius 3+* 3+  Rhomboids 5 5      Elbow  Flexion 5 5  Extension 5 5  Pronation    Supination        Wrist  Flexion 5 5  Extension 5 5  Radial deviation    Ulnar deviation        MCP  Flexion 5 5  Extension 5 5  Abduction 5 5  Adduction 5 5  (* = pain; Blank rows = not tested)  Sensation Grossly intact to light touch bilateral UE as determined by testing dermatomes C2-T2. Proprioception and hot/cold testing deferred on this date.  Reflexes Deferred  Palpation  Location LEFT  RIGHT           Subocciptials  0  Cervical paraspinals  0  Upper Trapezius  0  Levator Scapulae  0  Rhomboid Major/Minor    Sternoclavicular joint    Acromioclavicular joint  0  Coracoid process  0  Long head of biceps  1  Supraspinatus  0  Infraspinatus  0  Subscapularis  0  Teres Minor  0  Teres Major  0  Pectoralis Major  0   Pectoralis Minor    Anterior Deltoid  0  Lateral Deltoid  0  Posterior Deltoid  0  Latissimus Dorsi  0  Sternocleidomastoid    (Blank rows = not tested) Graded on 0-4 scale (0 = no pain, 1 = pain, 2 = pain with wincing/grimacing/flinching, 3 = pain with withdrawal, 4 = unwilling to allow palpation), (Blank rows = not tested)   Repeated Movements No centralization or peripheralization of symptoms with repeated cervical retraction.   Passive Accessory Intervertebral Motion Pt denies reproduction of shoulder pain with CPA C2-T7 and UPA bilaterally C2-T7. Mild tenderness centrally in cervical spine with pressure but no R shoulder pain. Grossly hypomobile throughout thoracic spine;  Accessory Motions/Glides Glenohumeral: Posterior: R: abnormal L: normal Inferior: R: normal L: normal Anterior: R: normal L: normal  Acromioclavicular:  Posterior: R: normal L: normal Anterior: R: normal L: normal  Sternoclavicular: Posterior: R: normal L: normal Anterior: R: normal L: normal Superior: R: normal L: normal Inferior: R: normal L: normal  Scapulothoracic: Not examined  Muscle Length Testing Not examined   SPECIAL TESTS Rotator Cuff  Drop Arm Test: Negative Painful Arc (Pain from 60 to 120 degrees scaption): Negative Infraspinatus Muscle Test: Negative  Subacromial Impingement Hawkins-Kennedy: Positive Neer (Block scapula, PROM flexion): Positive Painful Arc (Pain from 60 to 120 degrees scaption): Negative (pt has pain at 115 to end range) Empty Can: Negative External Rotation Resistance: Negative Horizontal Adduction: Not examined Scapular Assist: Positive for decrease in pain  Labral Tear Biceps Load II (120 elevation, full ER, 90 elbow flexion, full supination,  resisted elbow flexion): Negative Crank (160 scaption, axial load with IR/ER): Negative Active Compression Test: Not examined  Bicep Tendon Pathology Speed (shoulder flexion to 90, external rotation, full  elbow extension, and forearm supination with resistance: Negative Yergason's (resisted shoulder ER and supination/biceps tendon pathology): Negative  Shoulder Instability Sulcus Sign: Negative Anterior Apprehension: Negative  Beighton scale: Deferred    TODAY'S TREATMENT    SUBJECTIVE: Pt reports that her shoulder is doing alright today. No significant changes in shoulder since the last therapy session. However she has been having frequent episodes of vertigo/dizziness since the weekend.    PAIN: Denies   Canalith Repositioning Treatment  Negative L Dix-Hallpike Test for both vertigo and nystagmus. Positive R Dix-Hallpike test for vigorous upbeating R torsional nystagmus with concurrent severe vertigo which lasts for approximately 15 seconds. Findings consistent with R posterior canal BPPV. Pt treated with 2 bouts of Epley Maneuver for presumed R posterior canal BPPV. One minute holds in each position and retesting between maneuvers which is negative for both vertigo and nystagmus. After treatment performed Dix-Hallpike Test and Roll Test which are negative for vertigo and nystagmus. L Sidelying Test negative for vertigo and nystagmus. R sidelying test is positive for both mild vertigo and faint upbeating R torsional nystagmus. Pt treated with 2 bouts of R Liberatory Maneuver with 1 minute holds in each position. Retesting (sidelying test) remains positive on the R side for vertigo and nystagmus between maneuvers and after final maneuver. After second maneuver pt reporting some darkening in her vision in the left upper quadrant. Pt placed in supine and vitals obtained which were grossly WNL (141/85, HR: 90, SpO2: 98%). No additional symptoms reported. No facial weakness, numbness, tingling, unilateral weakness, or slurred speech. Vision slowly returns to normal. Due to nausea no further repositioning maneuvers attempted. Will consider deep head hang maneuver at next therapy session.      PATIENT EDUCATION:  Education details: Pt educated throughout session about proper posture and technique with exercises. Improved exercise technique, movement at target joints, use of target muscles after min to mod verbal, visual, tactile cues, BPPV Person educated: Patient Education method: Explanation, verbal cues, tactile cues Education comprehension: verbalized understanding and returned demonstration   HOME EXERCISE PROGRAM: Access Code: [email or web address] URL: [email or web address] Date: 06/15/2022 Prepared by: Roxana Hires  Exercises - Seated Isometric Shoulder Inferior Glide  - 1 x daily - 7 x weekly - 2 sets - 10 reps - 3s hold - Dynamic Hug with Resistance  - 1 x daily - 7 x weekly - 2 sets - 10 reps - 3s hold - Standing Low Shoulder Row with Anchored Resistance  - 1 x daily - 7 x weekly - 2 sets - 10 reps - 3s hold - Standing Shoulder Internal Rotation Stretch with Towel  - 2 x daily - 7 x weekly - 3 reps - 30s hold - Seated Shoulder Flexion Table Top Stretch  - 2 x daily - 7 x weekly - 3 reps - 30s hold - Single Arm Doorway Pec Stretch at 90 Degrees Abduction  - 2 x daily - 7 x weekly - 3 reps - 30s hold - Seated Shoulder Flexion AAROM with Pulley Behind (Mirrored)  - 2 x daily - 7 x weekly - 2 sets - 10 reps - Seated Shoulder Scaption AAROM with Pulley at Side (Mirrored)  - 2 x daily - 7 x weekly - 2 sets - 10 reps - Seated Shoulder Internal Rotation AAROM with Pulley  - 2 x  daily - 7 x weekly - 2 sets - 10 reps - Self-Epley Maneuver Right Ear  - 1 x daily - 7 x weekly - hold for 60s in each position hold  Patient Education - Shoulder Impingement - BPPV  ASSESSMENT:  CLINICAL IMPRESSION: Negative L Dix-Hallpike Test for both vertigo and nystagmus. Positive R Dix-Hallpike test for vigorous upbeating R torsional nystagmus with concurrent severe vertigo which lasts for approximately 15 seconds. Findings consistent with R posterior canal BPPV. Pt treated with 2  bouts of Epley Maneuver for presumed R posterior canal BPPV. One minute holds in each position and retesting between maneuvers which is negative for both vertigo and nystagmus. After treatment performed Dix-Hallpike Test and Roll Test which are negative for vertigo and nystagmus. L Sidelying Test negative for vertigo and nystagmus. R sidelying test is positive for both mild vertigo and faint upbeating R torsional nystagmus. Pt treated with 2 bouts of R Liberatory Maneuver with 1 minute holds in each position. Retesting (sidelying test) remains positive on the R side for vertigo and nystagmus between maneuvers and after final maneuver. After second maneuver pt reporting some darkening in her vision in the left upper quadrant. Pt placed in supine and vitals obtained which were grossly WNL (141/85, HR: 90, SpO2: 98%). No additional symptoms reported. No facial weakness, numbness, tingling, unilateral weakness, or slurred speech. Vision slowly returns to normal. Due to nausea no further repositioning maneuvers attempted. Will consider deep head hang maneuver at next therapy session. Unable to address shoulder at this time however she is making excellent progress with less frequent shoulder pain and improving range of motion. Pt encouraged to follow-up as scheduled. Patient will benefit from skilled PT to address above impairments and improve overall function.  REHAB POTENTIAL: Excellent  CLINICAL DECISION MAKING: Stable/uncomplicated  EVALUATION COMPLEXITY: Low   GOALS: Goals reviewed with patient? Yes  SHORT TERM GOALS: Target date: 06/30/2022  Pt will be independent with HEP to improve strength and decrease neck pain to improve pain-free function at home and work. Baseline:  Goal status: INITIAL   LONG TERM GOALS: Target date: 07/28/2022  Pt will increase FOTO to at least 57 to demonstrate significant improvement in function at home and work related to neck pain  Baseline: 06/02/22: 39 Goal status:  INITIAL  2.  Pt will decrease worst shoulder pain by at least 3 points on the NPRS in order to demonstrate clinically significant reduction in shoulder pain. Baseline: 06/02/22: worst: 6/10; Goal status: INITIAL  3.  Pt will decrease quick DASH score by at least 8% in order to demonstrate clinically significant reduction in disability related to shoulder pain        Baseline: 06/02/22: To be completed; 06/06/22: 13.6% Goal status: INITIAL  4. Pt will increase strength of R low trap by at least 1/2 MMT grade in order to demonstrate improvement in strength and function         Baseline: 06/02/22: 3+/5 Goal status: INITIAL  5. Pt will increase R shoulder flexion, abduction and IR AROM by at least 15 degrees each in order to restore normal shoulder motion to prevent pain with overhead and behind the back movements Baseline: 06/02/22: R shoulder: flexion: 150, Abduction: 135, IR: 45;  Goal status: INITIAL   PLAN: PT FREQUENCY: 1-2x/week  PT DURATION: 8 weeks  PLANNED INTERVENTIONS: Therapeutic exercises, Therapeutic activity, Neuromuscular re-education, Balance training, Gait training, Patient/Family education, Joint manipulation, Joint mobilization, Vestibular training, Canalith repositioning, Aquatic Therapy, Dry Needling, Electrical stimulation, Spinal manipulation,  Spinal mobilization, Cryotherapy, Moist heat, Traction, Ultrasound, Ionotophoresis '4mg'$ /ml Dexamethasone, and Manual therapy  PLAN FOR NEXT SESSION:  progressive strengthening of R upper quarter with manual techniques to improve ROM  Lyndel Safe Gussie Towson PT, DPT, GCS  Andjela Wickes 06/28/2022, 2:35 PM

## 2022-06-29 ENCOUNTER — Ambulatory Visit: Payer: 59

## 2022-06-29 DIAGNOSIS — M6281 Muscle weakness (generalized): Secondary | ICD-10-CM | POA: Diagnosis not present

## 2022-06-29 DIAGNOSIS — R42 Dizziness and giddiness: Secondary | ICD-10-CM | POA: Diagnosis not present

## 2022-06-29 DIAGNOSIS — G8929 Other chronic pain: Secondary | ICD-10-CM

## 2022-06-29 DIAGNOSIS — M25511 Pain in right shoulder: Secondary | ICD-10-CM | POA: Diagnosis not present

## 2022-06-29 NOTE — Therapy (Signed)
OUTPATIENT PHYSICAL THERAPY SHOULDER TREATMENT   Patient Name: Tonya Sampson MRN: [id number] DOB:1976-01-12, 46 y.o., female Today's Date: 06/30/2022   PT End of Session - 06/29/22 1750     Visit Number 8    Number of Visits 17    Date for PT Re-Evaluation 07/28/22    Authorization Type eval: 06/02/22    PT Start Time 1700    PT Stop Time 1745    PT Time Calculation (min) 45 min    Activity Tolerance Patient tolerated treatment well    Behavior During Therapy [facility] for tasks assessed/performed              Past Medical History:  Diagnosis Date   Allergic rhinitis    Chickenpox    Claustrophobia    Diabetes ([place])    GERD (gastroesophageal reflux disease)    Hyperlipidemia associated with type 2 diabetes mellitus ([place])    Hypertension    Past Surgical History:  Procedure Laterality Date   DILITATION & CURRETTAGE/HYSTROSCOPY WITH NOVASURE ABLATION N/A 09/11/2019   Procedure: DILATATION & CURETTAGE/HYSTEROSCOPY W ABLATION;  Surgeon: Gae Dry, MD;  Location: [facility] ORS;  Service: Gynecology;  Laterality: N/A;   FOOT SURGERY  2010   nodule removed from right foot   LUMBAR DISC SURGERY  2008   discectomy ([facility])   Patient Active Problem List   Diagnosis Date Noted   S/P endometrial ablation 09/25/2019   Menorrhagia 07/08/2019   Hyperlipidemia associated with type 2 diabetes mellitus ([facility])    GERD (gastroesophageal reflux disease)    Hypertension    Viral upper respiratory infection 12/20/2015   Diabetes ([place]) 12/20/2015   Benign nevus 12/20/2015   Abnormal mammogram 12/20/2015    PCP: Tracie Harrier, MD  REFERRING PROVIDER: Corky Mull, MD  REFERRING DIAGNOSIS: M25.511 (ICD-10-CM) - Pain in right shoulder G89.29 (ICD-10-CM) - Other chronic pain M75.81 (ICD-10-CM) - Other shoulder lesions, right shoulder   THERAPY DIAG: Chronic right shoulder pain  Muscle weakness (generalized)  RATIONALE FOR EVALUATION AND TREATMENT: Rehabilitation  ONSET DATE:  06/02/2018 (approximate, symptoms started 4 years ago)  FOLLOW UP APPT WITH PROVIDER: Yes    SUBJECTIVE:                                                                                                                                                                                         Chief Complaint: R shoulder pain  Pertinent History Pt reports chronic R shoulder pain which first started approximately 4 years ago and developed without any specific cause or injury. She reports that the pain flairs occasionally and the symptoms have worsened over the last few  months. The pain is localized to the anterior shoulder. She tried taking meloxicam regularly for a couple weeks without any notable improvement. At one point she was exercising at the gym three times/week however now working out in the gym irritates her shoulder. She also gets pain if she reaches behind her back, reaches overhead, or rolls onto her R side while sleeping (wakes her up). She denies any neck pain, numbness/tingling, or focal weakness in RUE. She is R hand dominant. Pt saw a chiropractor for 6 visits without improvement. She had an appointment with Dr. Roland Rack who referred her for physical therapy for R rotator cuff tendinitis. She reports that he offered a steroid injection however she is a diabetic is concerned about an increase in her blood sugar. She had plain films of her R shoulder which demonstrated no evidence for fractures, lytic lesions, or significant degenerative changes. The subacromial space is well-maintained. There is no subacromial or infra-clavicular spurring. She demonstrates a Type I acromion.   Pain:  Pain Intensity: Present: 0/10, Best: 0/10, Worst: 6/10 Pain location: Anterior and anterolateral R shoulder Pain Quality:  pressure, deep ache   Radiating: No  Numbness/Tingling: No Focal Weakness: No Aggravating factors: reaching behind the back, overhead motion pain with wall push-up, rolling onto R side;   Relieving factors: stop the activity, ibuprofen 24-hour pain behavior: no change throughout the day, activity dependent History of prior shoulder or neck/shoulder injury, pain, surgery, or therapy: No, she did see a chiropractor for 6 visits without improvement Falls: Has patient fallen in last 6 months? 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  Recent memory is intact.  Remote memory is intact.  Attention span and concentration are intact.  Expressive speech is intact.  Patient's fund of knowledge is within normal limits for educational level.    Gross Musculoskeletal Assessment Tremor: None Bulk: Normal Tone: Normal No edema, ecchymosis, or erythema noted around R shoulder;  Gait Deferred  Posture Forward head and rounded shoulders in sitting position. Partially able to correct with cues. In supine both shoulder protract anteriorly  Cervical Screen AROM: WFL and painless with overpressure in all planes Spurlings A (ipsilateral lateral flexion/axial compression): R: Negative L: Negative Spurlings B (ipsilateral lateral flexion/contralateral rotation/axial compression): R: Negative L: Negative Repeated movement: No centralization or peripheralization with repeated retraction Hoffman Sign (cervical  cord compression): R: Not tested L: Not tested ULTT Median: R: Not examined L: Not examined ULTT Ulnar: R: Not examined L: Not examined ULTT Radial: R: Not examined L: Not examined   AROM  AROM (Normal range in degrees) AROM 06/30/2022  Cervical  Flexion (50) WNL  Extension (80) WNL  Right lateral flexion (45) WNL  Left lateral flexion (45) WNL  Right rotation (85) WNL  Left rotation (85) WNL   Right Left  Shoulder    Flexion 150 (Pain starts at 115) 170  Extension  Abduction 135 175  External Rotation 72 85  Internal Rotation 45 70  Hands Behind Head T3 T3  Hands Behind Back Belt line T5      Elbow    Flexion WNL WNL  Extension WNL WNL  Pronation WNL WNL  Supination WNL WNL  (* = pain; Blank rows = not tested)    LE MMT:  MMT (out of 5) Right 06/30/2022 Left 06/30/2022  Cervical (isometric)  Flexion WNL  Extension WNL  Lateral Flexion WNL WNL  Rotation WNL WNL      Shoulder   Flexion 5* 5  Extension 5 5  Abduction 5* 5  External rotation 5 5  Internal rotation 5 5  Horizontal abduction 4+ 4+  Horizontal adduction    Lower Trapezius 3+* 3+  Rhomboids 5 5      Elbow  Flexion 5 5  Extension 5 5  Pronation    Supination        Wrist  Flexion 5 5  Extension 5 5  Radial deviation    Ulnar deviation        MCP  Flexion 5 5  Extension 5 5  Abduction 5 5  Adduction 5 5  (* = pain; Blank rows = not tested)  Sensation Grossly intact to light touch bilateral UE as determined by testing dermatomes C2-T2. Proprioception and hot/cold testing deferred on this date.  Reflexes Deferred  Palpation  Location LEFT  RIGHT           Subocciptials  0  Cervical paraspinals  0  Upper Trapezius  0  Levator Scapulae  0  Rhomboid Major/Minor    Sternoclavicular joint    Acromioclavicular joint  0  Coracoid process  0  Long head of biceps  1  Supraspinatus  0  Infraspinatus  0  Subscapularis  0  Teres Minor  0  Teres Major  0  Pectoralis Major  0   Pectoralis Minor    Anterior Deltoid  0  Lateral Deltoid  0  Posterior Deltoid  0  Latissimus Dorsi  0  Sternocleidomastoid    (Blank rows = not tested) Graded on 0-4 scale (0 = no pain, 1 = pain, 2 = pain with wincing/grimacing/flinching, 3 = pain with withdrawal, 4 = unwilling to allow palpation), (Blank rows = not tested)   Repeated Movements No centralization or peripheralization of symptoms with repeated cervical retraction.   Passive Accessory Intervertebral Motion Pt denies reproduction of shoulder pain with CPA C2-T7 and UPA bilaterally C2-T7. Mild tenderness centrally in cervical spine with pressure but no R shoulder pain. Grossly hypomobile throughout thoracic spine;  Accessory Motions/Glides Glenohumeral: Posterior: R: abnormal L: normal Inferior: R: normal L: normal Anterior: R: normal L: normal  Acromioclavicular:  Posterior: R: normal L: normal Anterior: R: normal L: normal  Sternoclavicular: Posterior: R: normal L: normal Anterior: R: normal L: normal Superior: R: normal L: normal Inferior: R: normal L: normal  Scapulothoracic: Not examined  Muscle Length Testing Not examined   SPECIAL TESTS Rotator Cuff  Drop Arm Test: Negative Painful Arc (Pain from 60 to 120 degrees scaption): Negative Infraspinatus Muscle Test: Negative  Subacromial Impingement Hawkins-Kennedy: Positive Neer (Block scapula, PROM flexion): Positive Painful Arc (Pain from 60 to 120 degrees scaption): Negative (pt has pain at 115 to end range) Empty Can: Negative External Rotation Resistance: Negative Horizontal Adduction: Not examined Scapular Assist: Positive for decrease in pain  Labral Tear Biceps Load II (120 elevation, full ER, 90 elbow flexion, full  supination, resisted elbow flexion): Negative Crank (160 scaption, axial load with IR/ER): Negative Active Compression Test: Not examined  Bicep Tendon Pathology Speed (shoulder flexion to 90, external rotation, full  elbow extension, and forearm supination with resistance: Negative Yergason's (resisted shoulder ER and supination/biceps tendon pathology): Negative  Shoulder Instability Sulcus Sign: Negative Anterior Apprehension: Negative  Beighton scale: Deferred    TODAY'S TREATMENT    SUBJECTIVE: Pt reports that her shoulder is doing alright today. No significant changes in shoulder since the last therapy session. However she has continued with vertigo and had to call out from work yesterday.    PAIN: Denies   Canalith Repositioning Treatment Negative L Dix-Hallpike Test for both vertigo and nystagmus. Positive R Dix-Hallpike test for vigorous upbeating R torsional nystagmus with concurrent severe vertigo which lasts for approximately 15 seconds. Findings consistent with R posterior canal BPPV. Pt treated with 1 bout of Epley Maneuver for presumed R posterior canal BPPV. One minute holds in each position followed by Deep Head Hang Maneuver with 1 minute holds in each position. R Dix-Hallpike retesting is negative for both vertigo and nystagmus. After treatment performed Roll Test which is negative bilaterally for vertigo and nystagmus. L Sidelying Test negative for vertigo and nystagmus. R sidelying test is positive for both mild vertigo and faint upbeating R torsional nystagmus. Pt treated with 1 bout of R Liberatory Maneuver with 1 minute holds in each position and vibration on mastoid. Retesting (sidelying test) remains positive on the R side for vertigo and nystagmus. Performed Brewing technologist followed by [employer] which remains positive for vertigo and upbeating R torsional nystagmus. R Dix-Hallpike performed on inverted mat table with head off edge in deep head hang position and is positive for vertigo and upbeating R torsional nystagmus. Performed R epley Maneuver for R side with head in deeper head hang position and 1 minute holds in each position. Afterward performed R Dix-Hallpike and  R Sidelying Test which are both negative for either vertigo or nystagmus. Pt unsteady after session once standing but no vertigo. No visual symptoms reported during session today. Pt encouraged to attempt the Epley Maneuver at home once/day to see if it accelerates resolution of BPPV.     PATIENT EDUCATION:  Education details: Pt educated throughout session about proper posture and technique with exercises. Improved exercise technique, movement at target joints, use of target muscles after min to mod verbal, visual, tactile cues, BPPV Person educated: Patient Education method: Explanation, verbal cues, tactile cues Education comprehension: verbalized understanding and returned demonstration   HOME EXERCISE PROGRAM: Access Code: [email or web address] URL: [email or web address] Date: 06/15/2022 Prepared by: Roxana Hires  Exercises - Seated Isometric Shoulder Inferior Glide  - 1 x daily - 7 x weekly - 2 sets - 10 reps - 3s hold - Dynamic Hug with Resistance  - 1 x daily - 7 x weekly - 2 sets - 10 reps - 3s hold - Standing Low Shoulder Row with Anchored Resistance  - 1 x daily - 7 x weekly - 2 sets - 10 reps - 3s hold - Standing Shoulder Internal Rotation Stretch with Towel  - 2 x daily - 7 x weekly - 3 reps - 30s hold - Seated Shoulder Flexion Table Top Stretch  - 2 x daily - 7 x weekly - 3 reps - 30s hold - Single Arm Doorway Pec Stretch at 90 Degrees Abduction  - 2 x daily - 7 x weekly - 3 reps - 30s hold - Seated  Shoulder Flexion AAROM with Pulley Behind (Mirrored)  - 2 x daily - 7 x weekly - 2 sets - 10 reps - Seated Shoulder Scaption AAROM with Pulley at Side (Mirrored)  - 2 x daily - 7 x weekly - 2 sets - 10 reps - Seated Shoulder Internal Rotation AAROM with Pulley  - 2 x daily - 7 x weekly - 2 sets - 10 reps - Self-Epley Maneuver Right Ear  - 1 x daily - 7 x weekly - hold for 60s in each position hold  Patient Education - Shoulder Impingement - BPPV  ASSESSMENT:  CLINICAL  IMPRESSION: Negative L Dix-Hallpike Test for both vertigo and nystagmus. Positive R Dix-Hallpike test for vigorous upbeating R torsional nystagmus with concurrent severe vertigo which lasts for approximately 15 seconds. Findings consistent with R posterior canal BPPV. Pt treated with 1 bout of Epley Maneuver for presumed R posterior canal BPPV. One minute holds in each position followed by Deep Head Hang Maneuver with 1 minute holds in each position. R Dix-Hallpike retesting is negative for both vertigo and nystagmus. After treatment performed Roll Test which is negative bilaterally for vertigo and nystagmus. L Sidelying Test negative for vertigo and nystagmus. R sidelying test is positive for both mild vertigo and faint upbeating R torsional nystagmus. Pt treated with 1 bout of R Liberatory Maneuver with 1 minute holds in each position and vibration on mastoid. Retesting (sidelying test) remains positive on the R side for vertigo and nystagmus. Performed Brewing technologist followed by [employer] which remains positive for vertigo and upbeating R torsional nystagmus. R Dix-Hallpike performed on inverted mat table with head off edge in deep head hang position and is positive for vertigo and upbeating R torsional nystagmus. Performed R epley Maneuver for R side with head in deeper head hang position and 1 minute holds in each position. Afterward performed R Dix-Hallpike and R Sidelying Test which are both negative for either vertigo or nystagmus. Pt unsteady after session once standing but no vertigo. No visual symptoms reported during session today. Pt encouraged to attempt the Epley Maneuver at home once/day to see if it accelerates resolution of BPPV. Vertigo remains brief duration and positional. Pt continues to deny any additional neurologic complaints/symptoms. Vertigo is causing quite a bit of debility and sessions have focused on treatment. Once fully resolved will readdress R shoulder issues.    REHAB POTENTIAL: Excellent  CLINICAL DECISION MAKING: Stable/uncomplicated  EVALUATION COMPLEXITY: Low   GOALS: Goals reviewed with patient? Yes  SHORT TERM GOALS: Target date: 06/30/2022  Pt will be independent with HEP to improve strength and decrease neck pain to improve pain-free function at home and work. Baseline:  Goal status: INITIAL   LONG TERM GOALS: Target date: 07/28/2022  Pt will increase FOTO to at least 57 to demonstrate significant improvement in function at home and work related to neck pain  Baseline: 06/02/22: 39 Goal status: INITIAL  2.  Pt will decrease worst shoulder pain by at least 3 points on the NPRS in order to demonstrate clinically significant reduction in shoulder pain. Baseline: 06/02/22: worst: 6/10; Goal status: INITIAL  3.  Pt will decrease quick DASH score by at least 8% in order to demonstrate clinically significant reduction in disability related to shoulder pain        Baseline: 06/02/22: To be completed; 06/06/22: 13.6% Goal status: INITIAL  4. Pt will increase strength of R low trap by at least 1/2 MMT grade in order to demonstrate  improvement in strength and function         Baseline: 06/02/22: 3+/5 Goal status: INITIAL  5. Pt will increase R shoulder flexion, abduction and IR AROM by at least 15 degrees each in order to restore normal shoulder motion to prevent pain with overhead and behind the back movements Baseline: 06/02/22: R shoulder: flexion: 150, Abduction: 135, IR: 45;  Goal status: INITIAL   PLAN: PT FREQUENCY: 1-2x/week  PT DURATION: 8 weeks  PLANNED INTERVENTIONS: Therapeutic exercises, Therapeutic activity, Neuromuscular re-education, Balance training, Gait training, Patient/Family education, Joint manipulation, Joint mobilization, Vestibular training, Canalith repositioning, Aquatic Therapy, Dry Needling, Electrical stimulation, Spinal manipulation, Spinal mobilization, Cryotherapy, Moist heat, Traction, Ultrasound,  Ionotophoresis '4mg'$ /ml Dexamethasone, and Manual therapy  PLAN FOR NEXT SESSION:  progressive strengthening of R upper quarter with manual techniques to improve ROM  Lyndel Safe Nalla Purdy PT, DPT, GCS  Xion Debruyne 06/30/2022, 1:33 PM

## 2022-06-30 ENCOUNTER — Other Ambulatory Visit: Payer: Self-pay

## 2022-07-02 ENCOUNTER — Other Ambulatory Visit: Payer: Self-pay

## 2022-07-03 ENCOUNTER — Other Ambulatory Visit: Payer: Self-pay

## 2022-07-03 MED ORDER — LOSARTAN POTASSIUM 25 MG PO TABS
25.0000 mg | ORAL_TABLET | Freq: Every day | ORAL | 1 refills | Status: DC
  Filled 2022-07-03: qty 90, 90d supply, fill #0
  Filled 2022-09-29: qty 90, 90d supply, fill #1

## 2022-07-03 MED ORDER — EMPAGLIFLOZIN 10 MG PO TABS
10.0000 mg | ORAL_TABLET | Freq: Every day | ORAL | 1 refills | Status: DC
Start: 2022-07-03 — End: 2022-12-27
  Filled 2022-07-03: qty 90, 90d supply, fill #0
  Filled 2022-09-29: qty 90, 90d supply, fill #1

## 2022-07-03 NOTE — Therapy (Unsigned)
OUTPATIENT PHYSICAL THERAPY SHOULDER TREATMENT   Patient Name: Tonya Sampson MRN: [id number] DOB:July 14, 1976, 46 y.o., female Today's Date: 07/05/2022   PT End of Session - 07/04/22 1703     Visit Number 9    Number of Visits 17    Date for PT Re-Evaluation 07/28/22    Authorization Type eval: 06/02/22    PT Start Time 1700    PT Stop Time 1745    PT Time Calculation (min) 45 min    Activity Tolerance Patient tolerated treatment well    Behavior During Therapy [facility] for tasks assessed/performed             Past Medical History:  Diagnosis Date   Allergic rhinitis    Chickenpox    Claustrophobia    Diabetes ([place])    GERD (gastroesophageal reflux disease)    Hyperlipidemia associated with type 2 diabetes mellitus ([place])    Hypertension    Past Surgical History:  Procedure Laterality Date   DILITATION & CURRETTAGE/HYSTROSCOPY WITH NOVASURE ABLATION N/A 09/11/2019   Procedure: DILATATION & CURETTAGE/HYSTEROSCOPY W ABLATION;  Surgeon: Gae Dry, MD;  Location: [facility] ORS;  Service: Gynecology;  Laterality: N/A;   FOOT SURGERY  2010   nodule removed from right foot   LUMBAR DISC SURGERY  2008   discectomy ([facility])   Patient Active Problem List   Diagnosis Date Noted   S/P endometrial ablation 09/25/2019   Menorrhagia 07/08/2019   Hyperlipidemia associated with type 2 diabetes mellitus ([facility])    GERD (gastroesophageal reflux disease)    Hypertension    Viral upper respiratory infection 12/20/2015   Diabetes ([place]) 12/20/2015   Benign nevus 12/20/2015   Abnormal mammogram 12/20/2015    PCP: Tracie Harrier, MD  REFERRING PROVIDER: Corky Mull, MD  REFERRING DIAGNOSIS: M25.511 (ICD-10-CM) - Pain in right shoulder G89.29 (ICD-10-CM) - Other chronic pain M75.81 (ICD-10-CM) - Other shoulder lesions, right shoulder   THERAPY DIAG: Chronic right shoulder pain  Muscle weakness (generalized)  RATIONALE FOR EVALUATION AND TREATMENT: Rehabilitation  ONSET DATE:  06/02/2018 (approximate, symptoms started 4 years ago)  FOLLOW UP APPT WITH PROVIDER: Yes    SUBJECTIVE:                                                                                                                                                                                         Chief Complaint: R shoulder pain  Pertinent History Pt reports chronic R shoulder pain which first started approximately 4 years ago and developed without any specific cause or injury. She reports that the pain flairs occasionally and the symptoms have worsened over the last few months.  The pain is localized to the anterior shoulder. She tried taking meloxicam regularly for a couple weeks without any notable improvement. At one point she was exercising at the gym three times/week however now working out in the gym irritates her shoulder. She also gets pain if she reaches behind her back, reaches overhead, or rolls onto her R side while sleeping (wakes her up). She denies any neck pain, numbness/tingling, or focal weakness in RUE. She is R hand dominant. Pt saw a chiropractor for 6 visits without improvement. She had an appointment with Dr. Roland Rack who referred her for physical therapy for R rotator cuff tendinitis. She reports that he offered a steroid injection however she is a diabetic is concerned about an increase in her blood sugar. She had plain films of her R shoulder which demonstrated no evidence for fractures, lytic lesions, or significant degenerative changes. The subacromial space is well-maintained. There is no subacromial or infra-clavicular spurring. She demonstrates a Type I acromion.   Pain:  Pain Intensity: Present: 0/10, Best: 0/10, Worst: 6/10 Pain location: Anterior and anterolateral R shoulder Pain Quality:  pressure, deep ache   Radiating: No  Numbness/Tingling: No Focal Weakness: No Aggravating factors: reaching behind the back, overhead motion pain with wall push-up, rolling onto R side;   Relieving factors: stop the activity, ibuprofen 24-hour pain behavior: no change throughout the day, activity dependent History of prior shoulder or neck/shoulder injury, pain, surgery, or therapy: No, she did see a chiropractor for 6 visits without improvement Falls: Has patient fallen in last 6 months? 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  Recent memory is intact.  Remote memory is intact.  Attention span and concentration are intact.  Expressive speech is intact.  Patient's fund of knowledge is within normal limits for educational level.    Gross Musculoskeletal Assessment Tremor: None Bulk: Normal Tone: Normal No edema, ecchymosis, or erythema noted around R shoulder;  Gait Deferred  Posture Forward head and rounded shoulders in sitting position. Partially able to correct with cues. In supine both shoulder protract anteriorly  Cervical Screen AROM: WFL and painless with overpressure in all planes Spurlings A (ipsilateral lateral flexion/axial compression): R: Negative L: Negative Spurlings B (ipsilateral lateral flexion/contralateral rotation/axial compression): R: Negative L: Negative Repeated movement: No centralization or peripheralization with repeated retraction Hoffman Sign (cervical  cord compression): R: Not tested L: Not tested ULTT Median: R: Not examined L: Not examined ULTT Ulnar: R: Not examined L: Not examined ULTT Radial: R: Not examined L: Not examined   AROM  AROM (Normal range in degrees) AROM 07/05/2022  Cervical  Flexion (50) WNL  Extension (80) WNL  Right lateral flexion (45) WNL  Left lateral flexion (45) WNL  Right rotation (85) WNL  Left rotation (85) WNL   Right Left  Shoulder    Flexion 150 (Pain starts at 115) 170  Extension    Abduction  135 175  External Rotation 72 85  Internal Rotation 45 70  Hands Behind Head T3 T3  Hands Behind Back Belt line T5      Elbow    Flexion WNL WNL  Extension WNL WNL  Pronation WNL WNL  Supination WNL WNL  (* = pain; Blank rows = not tested)   LE MMT:  MMT (out of 5) Right 07/05/2022 Left 07/05/2022  Cervical (isometric)  Flexion WNL  Extension WNL  Lateral Flexion WNL WNL  Rotation WNL WNL      Shoulder   Flexion 5* 5  Extension 5 5  Abduction 5* 5  External rotation 5 5  Internal rotation 5 5  Horizontal abduction 4+ 4+  Horizontal adduction    Lower Trapezius 3+* 3+  Rhomboids 5 5      Elbow  Flexion 5 5  Extension 5 5  Pronation    Supination        Wrist  Flexion 5 5  Extension 5 5  Radial deviation    Ulnar deviation        MCP  Flexion 5 5  Extension 5 5  Abduction 5 5  Adduction 5 5  (* = pain; Blank rows = not tested)  Sensation Grossly intact to light touch bilateral UE as determined by testing dermatomes C2-T2. Proprioception and hot/cold testing deferred on this date.  Reflexes Deferred  Palpation  Location LEFT  RIGHT           Subocciptials  0  Cervical paraspinals  0  Upper Trapezius  0  Levator Scapulae  0  Rhomboid Major/Minor    Sternoclavicular joint    Acromioclavicular joint  0  Coracoid process  0  Long head of biceps  1  Supraspinatus  0  Infraspinatus  0  Subscapularis  0  Teres Minor  0  Teres Major  0  Pectoralis Major  0   Pectoralis Minor    Anterior Deltoid  0  Lateral Deltoid  0  Posterior Deltoid  0  Latissimus Dorsi  0  Sternocleidomastoid    (Blank rows = not tested) Graded on 0-4 scale (0 = no pain, 1 = pain, 2 = pain with wincing/grimacing/flinching, 3 = pain with withdrawal, 4 = unwilling to allow palpation), (Blank rows = not tested)   Repeated Movements No centralization or peripheralization of symptoms with repeated cervical retraction.   Passive Accessory Intervertebral Motion Pt denies reproduction of shoulder pain with CPA C2-T7 and UPA bilaterally C2-T7. Mild tenderness centrally in cervical spine with pressure but no R shoulder pain. Grossly hypomobile throughout thoracic spine;  Accessory Motions/Glides Glenohumeral: Posterior: R: abnormal L: normal Inferior: R: normal L: normal Anterior: R: normal L: normal  Acromioclavicular:  Posterior: R: normal L: normal Anterior: R: normal L: normal  Sternoclavicular: Posterior: R: normal L: normal Anterior: R: normal L: normal Superior: R: normal L: normal Inferior: R: normal L: normal  Scapulothoracic: Not examined  Muscle Length Testing Not examined   SPECIAL TESTS Rotator Cuff  Drop Arm Test: Negative Painful Arc (Pain from 60 to 120 degrees scaption): Negative Infraspinatus Muscle Test: Negative  Subacromial Impingement Hawkins-Kennedy: Positive Neer (Block scapula, PROM flexion): Positive Painful Arc (Pain from 60 to 120 degrees scaption): Negative (pt has pain at 115 to end range) Empty Can: Negative External Rotation Resistance: Negative Horizontal Adduction: Not examined Scapular Assist: Positive for decrease in pain  Labral Tear Biceps Load II (120 elevation, full ER, 90 elbow flexion, full supination, resisted  elbow flexion): Negative Crank (160 scaption, axial load with IR/ER): Negative Active Compression Test: Not examined  Bicep Tendon Pathology Speed (shoulder flexion to 90, external rotation, full  elbow extension, and forearm supination with resistance: Negative Yergason's (resisted shoulder ER and supination/biceps tendon pathology): Negative  Shoulder Instability Sulcus Sign: Negative Anterior Apprehension: Negative  Beighton scale: Deferred    TODAY'S TREATMENT    SUBJECTIVE: Pt states that she felt very unwell after her last therapy session and the following day. However symptoms gradually improved over the weekend and she has not had any dizziness yesterday or today. Pt reports that her shoulder is doing alright today. No significant changes in shoulder recently. She did reach across her body while working and had recurrence of her pain.    PAIN: Denies   Neuromuscular Re-education  Negative Dix-Hallpike, Roll, and Sidelying Tests for both vertigo and nystagmus bilaterally;   Ther-ex  UBE x 4 minutes for warm-up during interval history (2 minutes forward/2 minutes backward) Seated pulleys for R shoulder flexion, abduction, and IR (standing) x multiple bouts each; Supine R shoulder flexion to 90 degrees with manual resistance from therapist x 10; Supine R shoulder serratus punch x 10; Supine R shoulder rhythmic stabilization x 30s; Reinforced importance of stretching at home;   Manual Therapy  Supine R GH distraction progressing through passive abduction ROM; Supine R GH AP mobilizations at neutral, grade II-III (pain limited) 30s/bout x 3 bouts; Supine R GH inferior mobilizations at 90 abduction, grade II-III (pain limited) 30s/bout x 2 bouts; Supine R GH AP mobilizations at 90 abduction and available end range ER, grade II-III (pain limited) 30s/bout x 3 bouts; Supine R GH AP mobilizations at 90 flexion through long axis, grade II-III, 30s/bout x 2 bouts;  R shoulder AAROM measurements after manual therapy:  Flexion: 154 Abduction: 140 ER: 69 IR: 62    PATIENT EDUCATION:  Education details: Pt educated throughout session about proper posture and technique  with exercises. Improved exercise technique, movement at target joints, use of target muscles after min to mod verbal, visual, tactile cues, BPPV Person educated: Patient Education method: Explanation, verbal cues, tactile cues Education comprehension: verbalized understanding and returned demonstration   HOME EXERCISE PROGRAM: Access Code: [email or web address] URL: [email or web address] Date: 06/15/2022 Prepared by: Roxana Hires  Exercises - Seated Isometric Shoulder Inferior Glide  - 1 x daily - 7 x weekly - 2 sets - 10 reps - 3s hold - Dynamic Hug with Resistance  - 1 x daily - 7 x weekly - 2 sets - 10 reps - 3s hold - Standing Low Shoulder Row with Anchored Resistance  - 1 x daily - 7 x weekly - 2 sets - 10 reps - 3s hold - Standing Shoulder Internal Rotation Stretch with Towel  - 2 x daily - 7 x weekly - 3 reps - 30s hold - Seated Shoulder Flexion Table Top Stretch  - 2 x daily - 7 x weekly - 3 reps - 30s hold - Single Arm Doorway Pec Stretch at 90 Degrees Abduction  - 2 x daily - 7 x weekly - 3 reps - 30s hold - Seated Shoulder Flexion AAROM with Pulley Behind (Mirrored)  - 2 x daily - 7 x weekly - 2 sets - 10 reps - Seated Shoulder Scaption AAROM with Pulley at Side (Mirrored)  - 2 x daily - 7 x weekly - 2 sets - 10 reps - Seated Shoulder Internal Rotation AAROM with Pulley  - 2 x daily - 7  x weekly - 2 sets - 10 reps - Self-Epley Maneuver Right Ear  - 1 x daily - 7 x weekly - hold for 60s in each position hold  Patient Education - Shoulder Impingement - BPPV  ASSESSMENT:  CLINICAL IMPRESSION: Negative Dix-Hallpike, Roll, and Sidelying Tests for both vertigo and nystagmus bilaterally. After retesting returned to treating her R shoulder. Reintroduced AAROM, manual therapy, and strengthening during session. Shoulder measurements obtained and she demonstrates a slight loss of flexion however abduction, ER, and IR are grossly the same as when they were last measured. Reinforced  the importance of regular stretching at home. Will continue to progress range of motion, strength, and motor control at follow-up sessions. Pt will benefit from PT services to address deficits in strength, pain, range of motion, and motor control in order to return to full function at home and work without pain.    REHAB POTENTIAL: Excellent  CLINICAL DECISION MAKING: Stable/uncomplicated  EVALUATION COMPLEXITY: Low   GOALS: Goals reviewed with patient? Yes  SHORT TERM GOALS: Target date: 06/30/2022  Pt will be independent with HEP to improve strength and decrease neck pain to improve pain-free function at home and work. Baseline:  Goal status: INITIAL   LONG TERM GOALS: Target date: 07/28/2022  Pt will increase FOTO to at least 57 to demonstrate significant improvement in function at home and work related to neck pain  Baseline: 06/02/22: 39 Goal status: INITIAL  2.  Pt will decrease worst shoulder pain by at least 3 points on the NPRS in order to demonstrate clinically significant reduction in shoulder pain. Baseline: 06/02/22: worst: 6/10; Goal status: INITIAL  3.  Pt will decrease quick DASH score by at least 8% in order to demonstrate clinically significant reduction in disability related to shoulder pain        Baseline: 06/02/22: To be completed; 06/06/22: 13.6% Goal status: INITIAL  4. Pt will increase strength of R low trap by at least 1/2 MMT grade in order to demonstrate improvement in strength and function         Baseline: 06/02/22: 3+/5 Goal status: INITIAL  5. Pt will increase R shoulder flexion, abduction and IR AROM by at least 15 degrees each in order to restore normal shoulder motion to prevent pain with overhead and behind the back movements Baseline: 06/02/22: R shoulder: flexion: 150, Abduction: 135, IR: 45;  Goal status: INITIAL   PLAN: PT FREQUENCY: 1-2x/week  PT DURATION: 8 weeks  PLANNED INTERVENTIONS: Therapeutic exercises, Therapeutic activity,  Neuromuscular re-education, Balance training, Gait training, Patient/Family education, Joint manipulation, Joint mobilization, Vestibular training, Canalith repositioning, Aquatic Therapy, Dry Needling, Electrical stimulation, Spinal manipulation, Spinal mobilization, Cryotherapy, Moist heat, Traction, Ultrasound, Ionotophoresis '4mg'$ /ml Dexamethasone, and Manual therapy  PLAN FOR NEXT SESSION:  progressive strengthening of R upper quarter with manual techniques to improve ROM  Chennel Olivos D Abrey Bradway PT, DPT, GCS  Phallon Haydu 07/05/2022, 1:22 PM

## 2022-07-04 ENCOUNTER — Ambulatory Visit: Payer: 59

## 2022-07-04 DIAGNOSIS — M25511 Pain in right shoulder: Secondary | ICD-10-CM | POA: Diagnosis not present

## 2022-07-04 DIAGNOSIS — G8929 Other chronic pain: Secondary | ICD-10-CM | POA: Diagnosis not present

## 2022-07-04 DIAGNOSIS — M6281 Muscle weakness (generalized): Secondary | ICD-10-CM | POA: Diagnosis not present

## 2022-07-04 DIAGNOSIS — R42 Dizziness and giddiness: Secondary | ICD-10-CM | POA: Diagnosis not present

## 2022-07-11 ENCOUNTER — Ambulatory Visit: Payer: 59 | Attending: Surgery

## 2022-07-11 DIAGNOSIS — G8929 Other chronic pain: Secondary | ICD-10-CM | POA: Insufficient documentation

## 2022-07-11 DIAGNOSIS — M6281 Muscle weakness (generalized): Secondary | ICD-10-CM | POA: Diagnosis not present

## 2022-07-11 DIAGNOSIS — M25511 Pain in right shoulder: Secondary | ICD-10-CM | POA: Diagnosis not present

## 2022-07-11 DIAGNOSIS — R42 Dizziness and giddiness: Secondary | ICD-10-CM | POA: Diagnosis not present

## 2022-07-11 NOTE — Therapy (Signed)
OUTPATIENT PHYSICAL THERAPY SHOULDER TREATMENT   Patient Name: Tonya Sampson MRN: [id number] DOB:07/18/1976, 46 y.o., female Today's Date: 07/11/2022   PT End of Session - 07/11/22 1702     Visit Number 10    Number of Visits 17    Date for PT Re-Evaluation 07/28/22    Authorization Type eval: 06/02/22    PT Start Time 1701    PT Stop Time 1744    PT Time Calculation (min) 43 min    Activity Tolerance Patient tolerated treatment well    Behavior During Therapy WFL for tasks assessed/performed             Past Medical History:  Diagnosis Date   Allergic rhinitis    Chickenpox    Claustrophobia    Diabetes ([place])    GERD (gastroesophageal reflux disease)    Hyperlipidemia associated with type 2 diabetes mellitus ([place])    Hypertension    Past Surgical History:  Procedure Laterality Date   DILITATION & CURRETTAGE/HYSTROSCOPY WITH NOVASURE ABLATION N/A 09/11/2019   Procedure: DILATATION & CURETTAGE/HYSTEROSCOPY W ABLATION;  Surgeon: Gae Dry, MD;  Location: [facility] ORS;  Service: Gynecology;  Laterality: N/A;   FOOT SURGERY  2010   nodule removed from right foot   LUMBAR DISC SURGERY  2008   discectomy ([facility])   Patient Active Problem List   Diagnosis Date Noted   S/P endometrial ablation 09/25/2019   Menorrhagia 07/08/2019   Hyperlipidemia associated with type 2 diabetes mellitus ([facility])    GERD (gastroesophageal reflux disease)    Hypertension    Viral upper respiratory infection 12/20/2015   Diabetes ([place]) 12/20/2015   Benign nevus 12/20/2015   Abnormal mammogram 12/20/2015    PCP: Tracie Harrier, MD  REFERRING PROVIDER: Corky Mull, MD  REFERRING DIAGNOSIS: M25.511 (ICD-10-CM) - Pain in right shoulder G89.29 (ICD-10-CM) - Other chronic pain M75.81 (ICD-10-CM) - Other shoulder lesions, right shoulder   THERAPY DIAG: Chronic right shoulder pain  Muscle weakness (generalized)  Dizziness and giddiness  RATIONALE FOR EVALUATION AND TREATMENT:  Rehabilitation  ONSET DATE: 06/02/2018 (approximate, symptoms started 4 years ago)  FOLLOW UP APPT WITH PROVIDER: Yes    SUBJECTIVE:                                                                                                                                                                                         Chief Complaint: R shoulder pain  Pertinent History Pt reports chronic R shoulder pain which first started approximately 4 years ago and developed without any specific cause or injury. She reports that the pain flairs occasionally and the symptoms have worsened over  the last few months. The pain is localized to the anterior shoulder. She tried taking meloxicam regularly for a couple weeks without any notable improvement. At one point she was exercising at the gym three times/week however now working out in the gym irritates her shoulder. She also gets pain if she reaches behind her back, reaches overhead, or rolls onto her R side while sleeping (wakes her up). She denies any neck pain, numbness/tingling, or focal weakness in RUE. She is R hand dominant. Pt saw a chiropractor for 6 visits without improvement. She had an appointment with Dr. Roland Rack who referred her for physical therapy for R rotator cuff tendinitis. She reports that he offered a steroid injection however she is a diabetic is concerned about an increase in her blood sugar. She had plain films of her R shoulder which demonstrated no evidence for fractures, lytic lesions, or significant degenerative changes. The subacromial space is well-maintained. There is no subacromial or infra-clavicular spurring. She demonstrates a Type I acromion.   Pain:  Pain Intensity: Present: 0/10, Best: 0/10, Worst: 6/10 Pain location: Anterior and anterolateral R shoulder Pain Quality:  pressure, deep ache   Radiating: No  Numbness/Tingling: No Focal Weakness: No Aggravating factors: reaching behind the back, overhead motion pain with wall  push-up, rolling onto R side;  Relieving factors: stop the activity, ibuprofen 24-hour pain behavior: no change throughout the day, activity dependent History of prior shoulder or neck/shoulder injury, pain, surgery, or therapy: No, she did see a chiropractor for 6 visits without improvement Falls: Has patient fallen in last 6 months? 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  Recent memory is intact.  Remote memory is intact.  Attention span and concentration are intact.  Expressive speech is intact.  Patient's fund of knowledge is within normal limits for educational level.    Gross Musculoskeletal Assessment Tremor: None Bulk: Normal Tone: Normal No edema, ecchymosis, or erythema noted around R shoulder;  Gait Deferred  Posture Forward head and rounded shoulders in sitting position. Partially able to correct with cues. In supine both shoulder protract anteriorly  Cervical Screen AROM: WFL and painless with overpressure in all planes Spurlings A (ipsilateral lateral flexion/axial compression): R: Negative L: Negative Spurlings B (ipsilateral lateral flexion/contralateral rotation/axial compression): R: Negative L: Negative Repeated movement: No centralization or peripheralization with repeated  retraction Hoffman Sign (cervical cord compression): R: Not tested L: Not tested ULTT Median: R: Not examined L: Not examined ULTT Ulnar: R: Not examined L: Not examined ULTT Radial: R: Not examined L: Not examined   AROM  AROM (Normal range in degrees) AROM 07/11/2022  Cervical  Flexion (50) WNL  Extension (80) WNL  Right lateral flexion (45) WNL  Left lateral flexion (45) WNL  Right rotation (85) WNL  Left rotation (85) WNL   Right Left  Shoulder    Flexion 150 (Pain starts at 115) 170  Extension  Abduction 135 175  External Rotation 72 85  Internal Rotation 45 70  Hands Behind Head T3 T3  Hands Behind Back Belt line T5      Elbow    Flexion WNL WNL  Extension WNL WNL  Pronation WNL WNL  Supination WNL WNL  (* = pain; Blank rows = not tested)   LE MMT:  MMT (out of 5) Right 07/11/2022 Left 07/11/2022  Cervical (isometric)  Flexion WNL  Extension WNL  Lateral Flexion WNL WNL  Rotation WNL WNL      Shoulder   Flexion 5* 5  Extension 5 5  Abduction 5* 5  External rotation 5 5  Internal rotation 5 5  Horizontal abduction 4+ 4+  Horizontal adduction    Lower Trapezius 3+* 3+  Rhomboids 5 5      Elbow  Flexion 5 5  Extension 5 5  Pronation    Supination        Wrist  Flexion 5 5  Extension 5 5  Radial deviation    Ulnar deviation        MCP  Flexion 5 5  Extension 5 5  Abduction 5 5  Adduction 5 5  (* = pain; Blank rows = not tested)  Sensation Grossly intact to light touch bilateral UE as determined by testing dermatomes C2-T2. Proprioception and hot/cold testing deferred on this date.  Reflexes Deferred  Palpation  Location LEFT  RIGHT           Subocciptials  0  Cervical paraspinals  0  Upper Trapezius  0  Levator Scapulae  0  Rhomboid Major/Minor    Sternoclavicular joint    Acromioclavicular joint  0  Coracoid process  0  Long head of biceps  1  Supraspinatus  0  Infraspinatus  0  Subscapularis  0  Teres Minor  0  Teres  Major  0  Pectoralis Major  0  Pectoralis Minor    Anterior Deltoid  0  Lateral Deltoid  0  Posterior Deltoid  0  Latissimus Dorsi  0  Sternocleidomastoid    (Blank rows = not tested) Graded on 0-4 scale (0 = no pain, 1 = pain, 2 = pain with wincing/grimacing/flinching, 3 = pain with withdrawal, 4 = unwilling to allow palpation), (Blank rows = not tested)   Repeated Movements No centralization or peripheralization of symptoms with repeated cervical retraction.   Passive Accessory Intervertebral Motion Pt denies reproduction of shoulder pain with CPA C2-T7 and UPA bilaterally C2-T7. Mild tenderness centrally in cervical spine with pressure but no R shoulder pain. Grossly hypomobile throughout thoracic spine;  Accessory Motions/Glides Glenohumeral: Posterior: R: abnormal L: normal Inferior: R: normal L: normal Anterior: R: normal L: normal  Acromioclavicular:  Posterior: R: normal L: normal Anterior: R: normal L: normal  Sternoclavicular: Posterior: R: normal L: normal Anterior: R: normal L: normal Superior: R: normal L: normal Inferior: R: normal L: normal  Scapulothoracic: Not examined  Muscle Length Testing Not examined   SPECIAL TESTS Rotator Cuff  Drop Arm Test: Negative Painful Arc (Pain from 60 to 120 degrees scaption): Negative Infraspinatus Muscle Test: Negative  Subacromial Impingement Hawkins-Kennedy: Positive Neer (Block scapula, PROM flexion): Positive Painful Arc (Pain from 60 to 120 degrees scaption): Negative (pt has pain at 115 to end range) Empty Can: Negative External Rotation Resistance: Negative Horizontal Adduction: Not examined Scapular Assist: Positive for decrease in pain  Labral Tear Biceps Load II (120 elevation, full ER, 90 elbow flexion, full supination,  resisted elbow flexion): Negative Crank (160 scaption, axial load with IR/ER): Negative Active Compression Test: Not examined  Bicep Tendon Pathology Speed (shoulder flexion to  90, external rotation, full elbow extension, and forearm supination with resistance: Negative Yergason's (resisted shoulder ER and supination/biceps tendon pathology): Negative  Shoulder Instability Sulcus Sign: Negative Anterior Apprehension: Negative  Beighton scale: Deferred    TODAY'S TREATMENT    SUBJECTIVE: Pt reports dizziness overall has improved. R shoulder remains the same. Still having difficulty with sleeping on R side despite adapting sleeping posture.    PAIN: Denies   Ther-ex  Seated pulleys in flexion and abduction: 2 minutes/direction   Review of short and long term goals. See goals section and clinical impression for details.  Standing 1# DB passes behind back. CW and CCW for 30 sec. Progressed to 2# DB. X30 sec/direction.  Standing AAROM shoulder flexion with 3# AW. X12. 5# AW. 2x8   Standing resisted ER with towel b/t torso on [place]ilus:   1x12, 10#  2x6, 20#   PATIENT EDUCATION:  Education details: Form/technique with exercise. Person educated: Patient Education method: Explanation, verbal cues, tactile cues Education comprehension: verbalized understanding and returned demonstration   HOME EXERCISE PROGRAM: Access Code: [email or web address] URL: [email or web address] Date: 06/15/2022 Prepared by: Roxana Hires  Exercises - Seated Isometric Shoulder Inferior Glide  - 1 x daily - 7 x weekly - 2 sets - 10 reps - 3s hold - Dynamic Hug with Resistance  - 1 x daily - 7 x weekly - 2 sets - 10 reps - 3s hold - Standing Low Shoulder Row with Anchored Resistance  - 1 x daily - 7 x weekly - 2 sets - 10 reps - 3s hold - Standing Shoulder Internal Rotation Stretch with Towel  - 2 x daily - 7 x weekly - 3 reps - 30s hold - Seated Shoulder Flexion Table Top Stretch  - 2 x daily - 7 x weekly - 3 reps - 30s hold - Single Arm Doorway Pec Stretch at 90 Degrees Abduction  - 2 x daily - 7 x weekly - 3 reps - 30s hold - Seated Shoulder Flexion AAROM with Pulley  Behind (Mirrored)  - 2 x daily - 7 x weekly - 2 sets - 10 reps - Seated Shoulder Scaption AAROM with Pulley at Side (Mirrored)  - 2 x daily - 7 x weekly - 2 sets - 10 reps - Seated Shoulder Internal Rotation AAROM with Pulley  - 2 x daily - 7 x weekly - 2 sets - 10 reps - Self-Epley Maneuver Right Ear  - 1 x daily - 7 x weekly - hold for 60s in each position hold  Patient Education - Shoulder Impingement - BPPV  ASSESSMENT:  CLINICAL IMPRESSION: Pt on 10th visit requiring progress note. Pt has made small progress towards FOTO goal and Lower trap strengthening but has regressed in shoulder AROM and remains with same score of worse pain. Possible decrease in shoulder outcome measures as pt reports having to focus on BPPV symptoms for 2-3 sessions. Pt encouraged to continue per POC to attempt to make progress to address R shoulder issues now that BPPV symptoms have resolved. Pt will continue to benefit per POC to address remaining deficits to meet all goals.     REHAB POTENTIAL: Excellent  CLINICAL DECISION MAKING: Stable/uncomplicated  EVALUATION COMPLEXITY: Low   GOALS: Goals reviewed with patient? Yes  SHORT TERM GOALS: Target date: 06/30/2022  Pt will be independent with HEP to improve strength  and decrease neck pain to improve pain-free function at home and work. Baseline: 07/11/2022, 2x/week Goal status: ON-GOING   LONG TERM GOALS: Target date: 07/28/2022  Pt will increase FOTO to at least 57 to demonstrate significant improvement in function at home and work related to neck pain  Baseline: 06/02/22: 39 Goal status: INITIAL  2.  Pt will decrease worst shoulder pain by at least 3 points on the NPRS in order to demonstrate clinically significant reduction in shoulder pain. Baseline: 06/02/22: worst: 6/10; 07/11/2022 up to 6/10 still.  Goal status: ON- GOING  3.  Pt will decrease quick DASH score by at least 8% in order to demonstrate clinically significant reduction in disability  related to shoulder pain        Baseline: 06/02/22: To be completed; 06/06/22: 13.6%; 07/11/22: 22.36% Goal status: ON-GOING  4. Pt will increase strength of R low trap by at least 1/2 MMT grade in order to demonstrate improvement in strength and function         Baseline: 06/02/22: 3+/5; 07/11/2022: 4/5  Goal status: ON-GOING  5. Pt will increase R shoulder flexion, abduction and IR AROM by at least 15 degrees each in order to restore normal shoulder motion to prevent pain with overhead and behind the back movements Baseline: 06/02/22: R shoulder: flexion: 150, Abduction: 135, IR: 45; R shoulder flexion: 145, abduction: 128, IR: 63   Goal status: ON-GOING   PLAN: PT FREQUENCY: 1-2x/week  PT DURATION: 8 weeks  PLANNED INTERVENTIONS: Therapeutic exercises, Therapeutic activity, Neuromuscular re-education, Balance training, Gait training, Patient/Family education, Joint manipulation, Joint mobilization, Vestibular training, Canalith repositioning, Aquatic Therapy, Dry Needling, Electrical stimulation, Spinal manipulation, Spinal mobilization, Cryotherapy, Moist heat, Traction, Ultrasound, Ionotophoresis '4mg'$ /ml Dexamethasone, and Manual therapy  PLAN FOR NEXT SESSION:  progressive strengthening of R upper quarter with manual techniques to improve ROM  Salem Caster. Fairly IV, PT, DPT Physical Therapist- [facility]  07/11/2022, 6:50 PM

## 2022-07-13 ENCOUNTER — Other Ambulatory Visit: Payer: Self-pay

## 2022-07-13 ENCOUNTER — Ambulatory Visit (INDEPENDENT_AMBULATORY_CARE_PROVIDER_SITE_OTHER): Payer: 59 | Admitting: Dermatology

## 2022-07-13 DIAGNOSIS — L304 Erythema intertrigo: Secondary | ICD-10-CM

## 2022-07-13 DIAGNOSIS — L732 Hidradenitis suppurativa: Secondary | ICD-10-CM

## 2022-07-13 DIAGNOSIS — L821 Other seborrheic keratosis: Secondary | ICD-10-CM

## 2022-07-13 DIAGNOSIS — D2262 Melanocytic nevi of left upper limb, including shoulder: Secondary | ICD-10-CM | POA: Diagnosis not present

## 2022-07-13 DIAGNOSIS — L578 Other skin changes due to chronic exposure to nonionizing radiation: Secondary | ICD-10-CM | POA: Diagnosis not present

## 2022-07-13 DIAGNOSIS — L814 Other melanin hyperpigmentation: Secondary | ICD-10-CM

## 2022-07-13 DIAGNOSIS — D229 Melanocytic nevi, unspecified: Secondary | ICD-10-CM

## 2022-07-13 DIAGNOSIS — Z1283 Encounter for screening for malignant neoplasm of skin: Secondary | ICD-10-CM | POA: Diagnosis not present

## 2022-07-13 DIAGNOSIS — L508 Other urticaria: Secondary | ICD-10-CM | POA: Diagnosis not present

## 2022-07-13 DIAGNOSIS — D1800 Hemangioma unspecified site: Secondary | ICD-10-CM

## 2022-07-13 DIAGNOSIS — W57XXXA Bitten or stung by nonvenomous insect and other nonvenomous arthropods, initial encounter: Secondary | ICD-10-CM

## 2022-07-13 DIAGNOSIS — L282 Other prurigo: Secondary | ICD-10-CM

## 2022-07-13 MED ORDER — HYDROCORTISONE 2.5 % EX CREA
TOPICAL_CREAM | CUTANEOUS | 1 refills | Status: DC
  Filled 2022-07-13: qty 30, 7d supply, fill #0

## 2022-07-13 MED ORDER — CLOBETASOL PROPIONATE 0.05 % EX OINT
TOPICAL_OINTMENT | CUTANEOUS | 0 refills | Status: AC
  Filled 2022-07-13: qty 30, 14d supply, fill #0

## 2022-07-13 MED ORDER — KETOCONAZOLE 2 % EX CREA
TOPICAL_CREAM | CUTANEOUS | 2 refills | Status: DC
  Filled 2022-07-13: qty 60, 30d supply, fill #0

## 2022-07-13 MED ORDER — CLINDAMYCIN PHOSPHATE 1 % EX SOLN
Freq: Every day | CUTANEOUS | 2 refills | Status: AC
  Filled 2022-07-13: qty 30, 20d supply, fill #0

## 2022-07-13 MED ORDER — DOXYCYCLINE HYCLATE 20 MG PO TABS
20.0000 mg | ORAL_TABLET | Freq: Two times a day (BID) | ORAL | 3 refills | Status: AC
  Filled 2022-07-13: qty 60, 30d supply, fill #0

## 2022-07-13 NOTE — Progress Notes (Signed)
Follow-Up Visit   Subjective  Tonya Sampson is a 46 y.o. female who presents for the following: Annual Exam (The patient presents for Total-Body Skin Exam (TBSE) for skin cancer screening and mole check.  The patient has spots, moles and lesions to be evaluated, some may be new or changing and the patient has concerns that these could be cancer. Patient with no hx skin cancer. ).  Family history of skin cancer - what type(s): mother - who affected: SCC  The following portions of the chart were reviewed this encounter and updated as appropriate:   Tobacco  Allergies  Meds  Problems  Med Hx  Surg Hx  Fam Hx      Review of Systems:  No other skin or systemic complaints except as noted in HPI or Assessment and Plan.  Objective  Well appearing patient in no apparent distress; mood and affect are within normal limits.  A full examination was performed including scalp, head, eyes, ears, nose, lips, neck, chest, axillae, abdomen, back, buttocks, bilateral upper extremities, bilateral lower extremities, hands, feet, fingers, toes, fingernails, and toenails. All findings within normal limits unless otherwise noted below.  Left Upper Arm 0.2 cm dark brown thin papule  low abdomen, inguinal folds Macerated erythematous patches at low abdomen, inguinal folds  groin, axilla Inflammatory papule at right suprapubic, small inflammatory papules, double open comedones, rare scar at groin Scars at axilla   Right Lower Leg - Anterior Edematous pink papules    Assessment & Plan  Nevus Left Upper Arm  Benign-appearing.  Observation.  Call clinic for new or changing lesions.  Recommend daily use of broad spectrum spf 30+ sunscreen to sun-exposed areas.    Erythema intertrigo low abdomen, inguinal folds  Start ketoconazole 2% cream twice daily as needed for rash. Start HC 2.5% cream twice daily for up to 2 weeks at a time.   Topical steroids (such as triamcinolone, fluocinolone,  fluocinonide, mometasone, clobetasol, halobetasol, betamethasone, hydrocortisone) can cause thinning and lightening of the skin if they are used for too long in the same area. Your physician has selected the right strength medicine for your problem and area affected on the body. Please use your medication only as directed by your physician to prevent side effects.   Intertrigo is a chronic recurrent rash that occurs in skin fold areas that may be associated with friction; heat; moisture; yeast; fungus; and bacteria.  It is exacerbated by increased movement / activity; sweating; and higher atmospheric temperature.   hydrocortisone 2.5 % cream - low abdomen, inguinal folds Apply twice daily for up to 2 weeks as needed.  ketoconazole (NIZORAL) 2 % cream - low abdomen, inguinal folds Apply twice daily as needed for rash.  Hidradenitis suppurativa groin, axilla  Start clindamycin solution to affected areas as needed.  Start doxycycline 20 mg twice daily with food as needed with flares.   Chronic and persistent condition with duration or expected duration over one year. Condition is symptomatic/ bothersome to patient. Not currently at goal.   Doxycycline should be taken with food to prevent nausea. Do not lay down for 30 minutes after taking. Be cautious with sun exposure and use good sun protection while on this medication. Pregnant women should not take this medication.    clindamycin (CLEOCIN T) 1 % external solution - groin, axilla Apply topically daily.  doxycycline (PERIOSTAT) 20 MG tablet - groin, axilla Take 1 tablet (20 mg total) by mouth 2 (two) times daily. Take with food  Papular urticaria Right Lower Leg - Anterior  Start clobetasol 0.05% ointment twice a day for up to 2 weeks as needed for itch/bug bites. Avoid applying to face, groin, and axilla. Use as directed. Long-term use can cause thinning of the skin.  Topical steroids (such as triamcinolone, fluocinolone,  fluocinonide, mometasone, clobetasol, halobetasol, betamethasone, hydrocortisone) can cause thinning and lightening of the skin if they are used for too long in the same area. Your physician has selected the right strength medicine for your problem and area affected on the body. Please use your medication only as directed by your physician to prevent side effects.    clobetasol ointment (TEMOVATE) 0.05 % - Right Lower Leg - Anterior Apply twice daily for up to 2 weeks as needed for itch/bug bites. Avoid applying to face, groin, and axilla. Use as directed. Long-term use can cause thinning of the skin.   Lentigines - Scattered tan macules - Due to sun exposure - Benign-appearing, observe - Recommend daily broad spectrum sunscreen SPF 30+ to sun-exposed areas, reapply every 2 hours as needed. - Call for any changes  Seborrheic Keratoses - Stuck-on, waxy, tan-brown papules and/or plaques  - Benign-appearing - Discussed benign etiology and prognosis. - Observe - Call for any changes  Melanocytic Nevi - Tan-brown and/or pink-flesh-colored symmetric macules and papules - Benign appearing on exam today - Observation - Call clinic for new or changing moles - Recommend daily use of broad spectrum spf 30+ sunscreen to sun-exposed areas.   Hemangiomas - Red papules - Discussed benign nature - Observe - Call for any changes  Actinic Damage - Chronic condition, secondary to cumulative UV/sun exposure - diffuse scaly erythematous macules with underlying dyspigmentation - Recommend daily broad spectrum sunscreen SPF 30+ to sun-exposed areas, reapply every 2 hours as needed.  - Staying in the shade or wearing long sleeves, sun glasses (UVA+UVB protection) and wide brim hats (4-inch brim around the entire circumference of the hat) are also recommended for sun protection.  - Call for new or changing lesions.  Skin cancer screening performed today.  Return today (on 07/13/2022) for  TBSE.  Graciella Belton, RMA, am acting as scribe for Forest Gleason, MD .  Documentation: I have reviewed the above documentation for accuracy and completeness, and I agree with the above.  Forest Gleason, MD

## 2022-07-13 NOTE — Patient Instructions (Addendum)
Start clobetasol 0.05% ointment twice a day for up to 2 weeks as needed for itch/bug bites. Avoid applying to face, groin, and axilla. Use as directed. Long-term use can cause thinning of the skin.  Topical steroids (such as triamcinolone, fluocinolone, fluocinonide, mometasone, clobetasol, halobetasol, betamethasone, hydrocortisone) can cause thinning and lightening of the skin if they are used for too long in the same area. Your physician has selected the right strength medicine for your problem and area affected on the body. Please use your medication only as directed by your physician to prevent side effects.   Start ketoconazole 2% cream twice daily as needed for rash at low abdomen. Start HC 2.5% cream twice daily for up to 2 weeks at a time at low abdomen.   Start clindamycin solution to affected areas as needed.  Start doxycycline 20 mg twice daily with food as needed with flares.   Doxycycline should be taken with food to prevent nausea. Do not lay down for 30 minutes after taking. Be cautious with sun exposure and use good sun protection while on this medication. Pregnant women should not take this medication.   Recommend taking Heliocare sun protection supplement daily in sunny weather for additional sun protection. For maximum protection on the sunniest days, you can take up to 2 capsules of regular Heliocare OR take 1 capsule of Heliocare Ultra. For prolonged exposure (such as a full day in the sun), you can repeat your dose of the supplement 4 hours after your first dose. Heliocare can be purchased at [employer], at some [facility] or at [email or web address].    Melanoma ABCDEs  Melanoma is the most dangerous type of skin cancer, and is the leading cause of death from skin disease.  You are more likely to develop melanoma if you: Have light-colored skin, light-colored eyes, or red or blond hair Spend a lot of time in the sun Tan regularly, either outdoors or in a tanning  bed Have had blistering sunburns, especially during childhood Have a close family member who has had a melanoma Have atypical moles or large birthmarks  Early detection of melanoma is key since treatment is typically straightforward and cure rates are extremely high if we catch it early.   The first sign of melanoma is often a change in a mole or a new dark spot.  The ABCDE system is a way of remembering the signs of melanoma.  A for asymmetry:  The two halves do not match. B for border:  The edges of the growth are irregular. C for color:  A mixture of colors are present instead of an even brown color. D for diameter:  Melanomas are usually (but not always) greater than 56m - the size of a pencil eraser. E for evolution:  The spot keeps changing in size, shape, and color.  Please check your skin once per month between visits. You can use a small mirror in front and a large mirror behind you to keep an eye on the back side or your body.   If you see any new or changing lesions before your next follow-up, please call to schedule a visit.  Please continue daily skin protection including broad spectrum sunscreen SPF 30+ to sun-exposed areas, reapplying every 2 hours as needed when you're outdoors.    Due to recent changes in healthcare laws, you may see results of your pathology and/or laboratory studies on MyChart before the doctors have had a chance to review them. We understand that in  some cases there may be results that are confusing or concerning to you. Please understand that not all results are received at the same time and often the doctors may need to interpret multiple results in order to provide you with the best plan of care or course of treatment. Therefore, we ask that you please give [place] 2 business days to thoroughly review all your results before contacting the office for clarification. Should we see a critical lab result, you will be contacted sooner.   If You Need Anything  After Your Visit  If you have any questions or concerns for your doctor, please call our main line at [phone] and press option 4 to reach your doctor's medical assistant. If no one answers, please leave a voicemail as directed and we will return your call as soon as possible. Messages left after 4 pm will be answered the following business day.   You may also send [place] a message via [place]. We typically respond to MyChart messages within 1-2 business days.  For prescription refills, please ask your pharmacy to contact our office. Our fax number is [phone].  If you have an urgent issue when the clinic is closed that cannot wait until the next business day, you can page your doctor at the number below.    Please note that while we do our best to be available for urgent issues outside of office hours, we are not available 24/7.   If you have an urgent issue and are unable to reach [place], you may choose to seek medical care at your doctor's office, retail clinic, urgent care center, or emergency room.  If you have a medical emergency, please immediately call 911 or go to the emergency department.  Pager Numbers  - Dr. Nehemiah Massed: [phone]  - Dr. Laurence Ferrari: [phone]  - Dr. Nicole Kindred: [phone]  In the event of inclement weather, please call our main line at [phone] for an update on the status of any delays or closures.  Dermatology Medication Tips: Please keep the boxes that topical medications come in in order to help keep track of the instructions about where and how to use these. Pharmacies typically print the medication instructions only on the boxes and not directly on the medication tubes.   If your medication is too expensive, please contact our office at [phone] option 4 or send [place] a message through [place].   We are unable to tell what your co-pay for medications will be in advance as this is different depending on your insurance coverage. However, we may be able  to find a substitute medication at lower cost or fill out paperwork to get insurance to cover a needed medication.   If a prior authorization is required to get your medication covered by your insurance company, please allow [place] 1-2 business days to complete this process.  Drug prices often vary depending on where the prescription is filled and some pharmacies may offer cheaper prices.  The website www.goodrx.com contains coupons for medications through different pharmacies. The prices here do not account for what the cost may be with help from insurance (it may be cheaper with your insurance), but the website can give you the price if you did not use any insurance.  - You can print the associated coupon and take it with your prescription to the pharmacy.  - You may also stop by our office during regular business hours and pick up a GoodRx coupon card.  - If you need your prescription  sent electronically to a different pharmacy, notify our office through [facility] or by phone at [phone] option 4.     Si Usted Necesita Algo Despus de Su Visita  Tambin puede enviarnos un mensaje a travs de Pharmacist, community. Por lo general respondemos a los mensajes de MyChart en el transcurso de 1 a 2 das hbiles.  Para renovar recetas, por favor pida a su farmacia que se ponga en contacto con nuestra oficina. Harland Dingwall de fax es [place] [phone].  Si tiene un asunto urgente cuando la clnica est cerrada y que no puede esperar hasta el siguiente da hbil, puede llamar/localizar a su doctor(a) al nmero que aparece a continuacin.   Por favor, tenga en cuenta que aunque hacemos todo lo posible para estar disponibles para asuntos urgentes fuera del horario de [place], no estamos disponibles las 24 horas del da, los 7 das de la [place].   Si tiene un problema urgente y no puede comunicarse con nosotros, puede optar por buscar atencin mdica  en el consultorio de su doctor(a), en una clnica  privada, en un centro de atencin urgente o en una sala de emergencias.  Si tiene Engineering geologist, por favor llame inmediatamente al 911 o vaya a la sala de emergencias.  Nmeros de bper  - Dr. Nehemiah Massed: [phone]  - Dra. Moye: [phone]  - Dra. Nicole Kindred: [phone]  En caso de inclemencias del [place], por favor llame a Johnsie Kindred principal al [phone] para una actualizacin sobre el [place] de cualquier retraso o cierre.  Consejos para la medicacin en dermatologa: Por favor, guarde las cajas en las que vienen los medicamentos de uso tpico para ayudarle a seguir las instrucciones sobre dnde y cmo usarlos. Las farmacias generalmente imprimen las instrucciones del medicamento slo en las cajas y no directamente en los tubos del [place].   Si su medicamento es muy caro, por favor, pngase en contacto con Zigmund Daniel llamando al [phone] y presione la opcin 4 o envenos un mensaje a travs de Pharmacist, community.   No podemos decirle cul ser su copago por los medicamentos por adelantado ya que esto es diferente dependiendo de la cobertura de su seguro. Sin embargo, es posible que podamos encontrar un medicamento sustituto a Electrical engineer un formulario para que el seguro cubra el medicamento que se considera necesario.   Si se requiere una autorizacin previa para que su compaa de seguros [place] su medicamento, por favor permtanos de 1 a 2 das hbiles para completar este proceso.  Los precios de los medicamentos varan con frecuencia dependiendo del Environmental consultant de dnde se surte la receta y alguna farmacias pueden ofrecer precios ms baratos.  El sitio web www.goodrx.com tiene cupones para medicamentos de Airline pilot. Los precios aqu no tienen en cuenta lo que podra costar con la ayuda del seguro (puede ser ms barato con su seguro), pero el sitio web puede darle el precio si no utiliz Research scientist (physical sciences).  - Puede imprimir el cupn correspondiente y  llevarlo con su receta a la farmacia.  - Tambin puede pasar por nuestra oficina durante el horario de atencin regular y Charity fundraiser una tarjeta de cupones de GoodRx.  - Si necesita que su receta se enve electrnicamente a una farmacia diferente, informe a nuestra oficina a travs de MyChart de [place] o por telfono llamando al [phone] y presione la opcin 4.

## 2022-07-14 ENCOUNTER — Other Ambulatory Visit: Payer: Self-pay

## 2022-07-14 ENCOUNTER — Ambulatory Visit: Payer: 59

## 2022-07-14 DIAGNOSIS — M6281 Muscle weakness (generalized): Secondary | ICD-10-CM | POA: Diagnosis not present

## 2022-07-14 DIAGNOSIS — G8929 Other chronic pain: Secondary | ICD-10-CM

## 2022-07-14 DIAGNOSIS — M25511 Pain in right shoulder: Secondary | ICD-10-CM | POA: Diagnosis not present

## 2022-07-14 DIAGNOSIS — R42 Dizziness and giddiness: Secondary | ICD-10-CM | POA: Diagnosis not present

## 2022-07-14 NOTE — Therapy (Addendum)
OUTPATIENT PHYSICAL THERAPY SHOULDER TREATMENT   Patient Name: Tonya Sampson MRN: [id number] DOB:July 26, 1976, 46 y.o., female Today's Date: 07/14/2022   PT End of Session - 07/14/22 0904     Visit Number 11    Number of Visits 17    Date for PT Re-Evaluation 07/28/22    Authorization Type eval: 06/02/22    PT Start Time 0845    PT Stop Time 0930    PT Time Calculation (min) 45 min    Activity Tolerance Patient tolerated treatment well    Behavior During Therapy [facility] for tasks assessed/performed             Past Medical History:  Diagnosis Date   Allergic rhinitis    Chickenpox    Claustrophobia    Diabetes ([place])    GERD (gastroesophageal reflux disease)    Hyperlipidemia associated with type 2 diabetes mellitus ([place])    Hypertension    Past Surgical History:  Procedure Laterality Date   DILITATION & CURRETTAGE/HYSTROSCOPY WITH NOVASURE ABLATION N/A 09/11/2019   Procedure: DILATATION & CURETTAGE/HYSTEROSCOPY W ABLATION;  Surgeon: Gae Dry, MD;  Location: [facility] ORS;  Service: Gynecology;  Laterality: N/A;   FOOT SURGERY  2010   nodule removed from right foot   LUMBAR DISC SURGERY  2008   discectomy ([facility])   Patient Active Problem List   Diagnosis Date Noted   S/P endometrial ablation 09/25/2019   Menorrhagia 07/08/2019   Hyperlipidemia associated with type 2 diabetes mellitus ([facility])    GERD (gastroesophageal reflux disease)    Hypertension    Viral upper respiratory infection 12/20/2015   Diabetes ([place]) 12/20/2015   Benign nevus 12/20/2015   Abnormal mammogram 12/20/2015    PCP: Tracie Harrier, MD  REFERRING PROVIDER: Corky Mull, MD  REFERRING DIAGNOSIS: M25.511 (ICD-10-CM) - Pain in right shoulder G89.29 (ICD-10-CM) - Other chronic pain M75.81 (ICD-10-CM) - Other shoulder lesions, right shoulder   THERAPY DIAG: No diagnosis found.  RATIONALE FOR EVALUATION AND TREATMENT: Rehabilitation  ONSET DATE: 06/02/2018 (approximate, symptoms started 4  years ago)  FOLLOW UP APPT WITH PROVIDER: Yes    SUBJECTIVE:                                                                                                                                                                                         Chief Complaint: R shoulder pain  Pertinent History Pt reports chronic R shoulder pain which first started approximately 4 years ago and developed without any specific cause or injury. She reports that the pain flairs occasionally and the symptoms have worsened over the last few months. The pain is localized to  the anterior shoulder. She tried taking meloxicam regularly for a couple weeks without any notable improvement. At one point she was exercising at the gym three times/week however now working out in the gym irritates her shoulder. She also gets pain if she reaches behind her back, reaches overhead, or rolls onto her R side while sleeping (wakes her up). She denies any neck pain, numbness/tingling, or focal weakness in RUE. She is R hand dominant. Pt saw a chiropractor for 6 visits without improvement. She had an appointment with Dr. Roland Rack who referred her for physical therapy for R rotator cuff tendinitis. She reports that he offered a steroid injection however she is a diabetic is concerned about an increase in her blood sugar. She had plain films of her R shoulder which demonstrated no evidence for fractures, lytic lesions, or significant degenerative changes. The subacromial space is well-maintained. There is no subacromial or infra-clavicular spurring. She demonstrates a Type I acromion.   Pain:  Pain Intensity: Present: 0/10, Best: 0/10, Worst: 6/10 Pain location: Anterior and anterolateral R shoulder Pain Quality:  pressure, deep ache   Radiating: No  Numbness/Tingling: No Focal Weakness: No Aggravating factors: reaching behind the back, overhead motion pain with wall push-up, rolling onto R side;  Relieving factors: stop the activity,  ibuprofen 24-hour pain behavior: no change throughout the day, activity dependent History of prior shoulder or neck/shoulder injury, pain, surgery, or therapy: No, she did see a chiropractor for 6 visits without improvement Falls: Has patient fallen in last 6 months? 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  Recent memory is intact.  Remote memory is intact.  Attention span and concentration are intact.  Expressive speech is intact.  Patient's fund of knowledge is within normal limits for educational level.    Gross Musculoskeletal Assessment Tremor: None Bulk: Normal Tone: Normal No edema, ecchymosis, or erythema noted around R shoulder;  Gait Deferred  Posture Forward head and rounded shoulders in sitting position. Partially able to correct with cues. In supine both shoulder protract anteriorly  Cervical Screen AROM: WFL and painless with overpressure in all planes Spurlings A (ipsilateral lateral flexion/axial compression): R: Negative L: Negative Spurlings B (ipsilateral lateral flexion/contralateral rotation/axial compression): R: Negative L: Negative Repeated movement: No centralization or peripheralization with repeated retraction Hoffman Sign (cervical cord compression): R: Not tested L: Not  tested ULTT Median: R: Not examined L: Not examined ULTT Ulnar: R: Not examined L: Not examined ULTT Radial: R: Not examined L: Not examined   AROM  AROM (Normal range in degrees) AROM 07/14/2022  Cervical  Flexion (50) WNL  Extension (80) WNL  Right lateral flexion (45) WNL  Left lateral flexion (45) WNL  Right rotation (85) WNL  Left rotation (85) WNL   Right Left  Shoulder    Flexion 150 (Pain starts at 115) 170  Extension    Abduction 135 175  External Rotation  72 85  Internal Rotation 45 70  Hands Behind Head T3 T3  Hands Behind Back Belt line T5      Elbow    Flexion WNL WNL  Extension WNL WNL  Pronation WNL WNL  Supination WNL WNL  (* = pain; Blank rows = not tested)   LE MMT:  MMT (out of 5) Right 07/14/2022 Left 07/14/2022  Cervical (isometric)  Flexion WNL  Extension WNL  Lateral Flexion WNL WNL  Rotation WNL WNL      Shoulder   Flexion 5* 5  Extension 5 5  Abduction 5* 5  External rotation 5 5  Internal rotation 5 5  Horizontal abduction 4+ 4+  Horizontal adduction    Lower Trapezius 3+* 3+  Rhomboids 5 5      Elbow  Flexion 5 5  Extension 5 5  Pronation    Supination        Wrist  Flexion 5 5  Extension 5 5  Radial deviation    Ulnar deviation        MCP  Flexion 5 5  Extension 5 5  Abduction 5 5  Adduction 5 5  (* = pain; Blank rows = not tested)  Sensation Grossly intact to light touch bilateral UE as determined by testing dermatomes C2-T2. Proprioception and hot/cold testing deferred on this date.  Reflexes Deferred  Palpation  Location LEFT  RIGHT           Subocciptials  0  Cervical paraspinals  0  Upper Trapezius  0  Levator Scapulae  0  Rhomboid Major/Minor    Sternoclavicular joint    Acromioclavicular joint  0  Coracoid process  0  Long head of biceps  1  Supraspinatus  0  Infraspinatus  0  Subscapularis  0  Teres Minor  0  Teres Major  0  Pectoralis Major  0  Pectoralis Minor    Anterior Deltoid  0   Lateral Deltoid  0  Posterior Deltoid  0  Latissimus Dorsi  0  Sternocleidomastoid    (Blank rows = not tested) Graded on 0-4 scale (0 = no pain, 1 = pain, 2 = pain with wincing/grimacing/flinching, 3 = pain with withdrawal, 4 = unwilling to allow palpation), (Blank rows = not tested)   Repeated Movements No centralization or peripheralization of symptoms with repeated cervical retraction.   Passive Accessory Intervertebral Motion Pt denies reproduction of shoulder pain with CPA C2-T7 and UPA bilaterally C2-T7. Mild tenderness centrally in cervical spine with pressure but no R shoulder pain. Grossly hypomobile throughout thoracic spine;  Accessory Motions/Glides Glenohumeral: Posterior: R: abnormal L: normal Inferior: R: normal L: normal Anterior: R: normal L: normal  Acromioclavicular:  Posterior: R: normal L: normal Anterior: R: normal L: normal  Sternoclavicular: Posterior: R: normal L: normal Anterior: R: normal L: normal Superior: R: normal L: normal Inferior: R: normal L: normal  Scapulothoracic: Not examined  Muscle Length Testing Not examined   SPECIAL TESTS Rotator Cuff  Drop Arm Test: Negative Painful Arc (Pain from 60 to 120 degrees scaption): Negative Infraspinatus Muscle Test: Negative  Subacromial Impingement Hawkins-Kennedy: Positive Neer (Block scapula, PROM flexion): Positive Painful Arc (Pain from 60 to 120 degrees scaption): Negative (pt has pain at 115 to end range) Empty Can: Negative External Rotation Resistance: Negative Horizontal Adduction: Not examined Scapular Assist: Positive for decrease in pain  Labral Tear Biceps Load II (120 elevation, full ER, 90 elbow flexion, full supination, resisted elbow flexion): Negative Crank (160  scaption, axial load with IR/ER): Negative Active Compression Test: Not examined  Bicep Tendon Pathology Speed (shoulder flexion to 90, external rotation, full elbow extension, and forearm supination with  resistance: Negative Yergason's (resisted shoulder ER and supination/biceps tendon pathology): Negative  Shoulder Instability Sulcus Sign: Negative Anterior Apprehension: Negative  Beighton scale: Deferred    TODAY'S TREATMENT   SUBJECTIVE: Pt she still has difficulty reaching R hand behind back due to discomfort and tightness.  She also has difficulty lying on R shoulder at night due to pain in R shoulder.    PAIN: Denies   Ther-ex  UBE x 4 minutes for warm-up during interval history (2 minutes forward/2 minutes backward) Seated pulleys for R shoulder flexion, abduction, and IR (standing) x multiple bouts each; Standing shoulder AROM: flexion, abd, IR behind back 5x ea to assess throughout session HBB stretch with blue t-band 10 sec x5 HBB contract/relax/stretch (emphasizing external rotators 25% contract/relax) 5 sec x 3, 2 sets standing in front of mirror- gave for HEP    Manual Therapy  Supine R GH distraction progressing through passive abduction ROM; Supine R GH AP mobilizations at neutral, grade II-III (pain limited) 30s/bout x 3 bouts; Supine R GH inferior mobilizations at 90 abduction, grade II-III (pain limited) 30s/bout x 2 bouts; Supine R GH AP mobilizations at 90 abduction and available end range ER, grade II-III (pain limited) 30s/bout x 3 bouts; Supine R GH AP mobilizations at 90 flexion through long axis, grade II-III, 30s/bout x 2 bouts; Seated hand behind back [place] AP mob grade II/III 30s/bout x 2 bouts  Not today: Supine R shoulder flexion to 90 degrees with manual resistance from therapist x 10; Supine R shoulder serratus punch x 10; Supine R shoulder rhythmic stabilization x 30s; Reinforced importance of stretching at home;  PATIENT EDUCATION:  Education details: Form/technique with exercise. Person educated: Patient Education method: Explanation, verbal cues, tactile cues Education comprehension: verbalized understanding and returned  demonstration   HOME EXERCISE PROGRAM: Access Code: [email or web address] URL: [email or web address] Date: 06/15/2022 Prepared by: Roxana Hires  Exercises - Seated Isometric Shoulder Inferior Glide  - 1 x daily - 7 x weekly - 2 sets - 10 reps - 3s hold - Dynamic Hug with Resistance  - 1 x daily - 7 x weekly - 2 sets - 10 reps - 3s hold - Standing Low Shoulder Row with Anchored Resistance  - 1 x daily - 7 x weekly - 2 sets - 10 reps - 3s hold - Standing Shoulder Internal Rotation Stretch with Towel  - 2 x daily - 7 x weekly - 3 reps - 30s hold - Seated Shoulder Flexion Table Top Stretch  - 2 x daily - 7 x weekly - 3 reps - 30s hold - Single Arm Doorway Pec Stretch at 90 Degrees Abduction  - 2 x daily - 7 x weekly - 3 reps - 30s hold - Seated Shoulder Flexion AAROM with Pulley Behind (Mirrored)  - 2 x daily - 7 x weekly - 2 sets - 10 reps - Seated Shoulder Scaption AAROM with Pulley at Side (Mirrored)  - 2 x daily - 7 x weekly - 2 sets - 10 reps - Seated Shoulder Internal Rotation AAROM with Pulley  - 2 x daily - 7 x weekly - 2 sets - 10 reps - Self-Epley Maneuver Right Ear  - 1 x daily - 7 x weekly - hold for 60s in each position hold  Patient Education - Shoulder Impingement - BPPV  ASSESSMENT:  CLINICAL IMPRESSION: Pt tolerated GH joint mob in HBB IR position well and noted that this mobilization technique felt helpful for the discomfort /tightness she experiences in this position.   She also had an improvement in AROM after mob as HBB IR AROM improved from thumb at mid glute to thumb at L3 at end of tx session.  Progressed HEP with contract/relax technique for HBB IR as this is her most limited AROM at this point.  Will likely benefit from resuming strengthening too at next visit. Pt will continue to benefit per POC to address remaining deficits to meet all goals.     REHAB POTENTIAL: Excellent  CLINICAL DECISION MAKING: Stable/uncomplicated  EVALUATION COMPLEXITY:  Low   GOALS: Goals reviewed with patient? Yes  SHORT TERM GOALS: Target date: 06/30/2022  Pt will be independent with HEP to improve strength and decrease neck pain to improve pain-free function at home and work. Baseline: 07/11/2022, 2x/week Goal status: ON-GOING   LONG TERM GOALS: Target date: 07/28/2022  Pt will increase FOTO to at least 57 to demonstrate significant improvement in function at home and work related to neck pain  Baseline: 06/02/22: 39 Goal status: INITIAL  2.  Pt will decrease worst shoulder pain by at least 3 points on the NPRS in order to demonstrate clinically significant reduction in shoulder pain. Baseline: 06/02/22: worst: 6/10; 07/11/2022 up to 6/10 still.  Goal status: ON- GOING  3.  Pt will decrease quick DASH score by at least 8% in order to demonstrate clinically significant reduction in disability related to shoulder pain        Baseline: 06/02/22: To be completed; 06/06/22: 13.6%; 07/11/22: 22.36% Goal status: ON-GOING  4. Pt will increase strength of R low trap by at least 1/2 MMT grade in order to demonstrate improvement in strength and function         Baseline: 06/02/22: 3+/5; 07/11/2022: 4/5  Goal status: ON-GOING  5. Pt will increase R shoulder flexion, abduction and IR AROM by at least 15 degrees each in order to restore normal shoulder motion to prevent pain with overhead and behind the back movements Baseline: 06/02/22: R shoulder: flexion: 150, Abduction: 135, IR: 45; R shoulder flexion: 145, abduction: 128, IR: 63   Goal status: ON-GOING   PLAN: PT FREQUENCY: 1-2x/week  PT DURATION: 8 weeks  PLANNED INTERVENTIONS: Therapeutic exercises, Therapeutic activity, Neuromuscular re-education, Balance training, Gait training, Patient/Family education, Joint manipulation, Joint mobilization, Vestibular training, Canalith repositioning, Aquatic Therapy, Dry Needling, Electrical stimulation, Spinal manipulation, Spinal mobilization, Cryotherapy, Moist heat,  Traction, Ultrasound, Ionotophoresis '4mg'$ /ml Dexamethasone, and Manual therapy  PLAN FOR NEXT SESSION:  progressive strengthening of R upper quarter with manual techniques to improve ROM  Merdis Delay, PT, DPT, OCS  [phone]  Physical Therapist- [facility]  07/14/2022, 10:31 AM   [facility] [address] [place], [place], [address] Phone: [phone]   Fax:  [phone]  Patient Details  Name: Tonya Sampson MRN: [id number] Date of Birth: 1975-12-22 Referring Provider:  Corky Mull, MD  Encounter Date: 07/14/2022   Pincus Badder, PT 07/14/2022, 9:09 AM  [facility] [address] [place], [place], [address] Phone: [phone]   Fax:  [phone]

## 2022-07-18 ENCOUNTER — Ambulatory Visit: Payer: 59

## 2022-07-24 ENCOUNTER — Encounter: Payer: Self-pay | Admitting: Dermatology

## 2022-08-01 ENCOUNTER — Ambulatory Visit: Payer: 59

## 2022-08-01 DIAGNOSIS — Z01419 Encounter for gynecological examination (general) (routine) without abnormal findings: Secondary | ICD-10-CM | POA: Diagnosis not present

## 2022-08-01 DIAGNOSIS — G8929 Other chronic pain: Secondary | ICD-10-CM | POA: Diagnosis not present

## 2022-08-01 DIAGNOSIS — M6281 Muscle weakness (generalized): Secondary | ICD-10-CM

## 2022-08-01 DIAGNOSIS — Z1331 Encounter for screening for depression: Secondary | ICD-10-CM | POA: Diagnosis not present

## 2022-08-01 DIAGNOSIS — Z124 Encounter for screening for malignant neoplasm of cervix: Secondary | ICD-10-CM | POA: Diagnosis not present

## 2022-08-01 DIAGNOSIS — M25511 Pain in right shoulder: Secondary | ICD-10-CM | POA: Diagnosis not present

## 2022-08-01 DIAGNOSIS — R42 Dizziness and giddiness: Secondary | ICD-10-CM | POA: Diagnosis not present

## 2022-08-01 NOTE — Therapy (Signed)
OUTPATIENT PHYSICAL THERAPY SHOULDER TREATMENT/RECERTIFICATION/DISCHARGE   Patient Name: Tonya Sampson MRN: [id number] DOB:05/03/1976, 46 y.o., female Today's Date: 08/04/2022   PT End of Session - 08/04/22 0914     Visit Number 12    Number of Visits 18    Date for PT Re-Evaluation 08/06/22    Authorization Type eval: 06/02/22    PT Start Time 1615    PT Stop Time 1700    PT Time Calculation (min) 45 min    Activity Tolerance Patient tolerated treatment well    Behavior During Therapy [facility] for tasks assessed/performed            Past Medical History:  Diagnosis Date   Allergic rhinitis    Chickenpox    Claustrophobia    Diabetes ([place])    GERD (gastroesophageal reflux disease)    Hyperlipidemia associated with type 2 diabetes mellitus ([place])    Hypertension    Past Surgical History:  Procedure Laterality Date   DILITATION & CURRETTAGE/HYSTROSCOPY WITH NOVASURE ABLATION N/A 09/11/2019   Procedure: DILATATION & CURETTAGE/HYSTEROSCOPY W ABLATION;  Surgeon: Gae Dry, MD;  Location: [facility] ORS;  Service: Gynecology;  Laterality: N/A;   FOOT SURGERY  2010   nodule removed from right foot   LUMBAR DISC SURGERY  2008   discectomy ([facility])   Patient Active Problem List   Diagnosis Date Noted   S/P endometrial ablation 09/25/2019   Menorrhagia 07/08/2019   Hyperlipidemia associated with type 2 diabetes mellitus ([facility])    GERD (gastroesophageal reflux disease)    Hypertension    Viral upper respiratory infection 12/20/2015   Diabetes ([place]) 12/20/2015   Benign nevus 12/20/2015   Abnormal mammogram 12/20/2015    PCP: Tracie Harrier, MD  REFERRING PROVIDER: Tracie Harrier, MD  REFERRING DIAGNOSIS: M25.511 (ICD-10-CM) - Pain in right shoulder G89.29 (ICD-10-CM) - Other chronic pain M75.81 (ICD-10-CM) - Other shoulder lesions, right shoulder   THERAPY DIAG: Chronic right shoulder pain  Muscle weakness (generalized)  RATIONALE FOR EVALUATION AND TREATMENT:  Rehabilitation  ONSET DATE: 06/02/2018 (approximate, symptoms started 4 years ago)  FOLLOW UP APPT WITH PROVIDER: Yes    SUBJECTIVE:                                                                                                                                                                                         Chief Complaint: R shoulder pain  Pertinent History Pt reports chronic R shoulder pain which first started approximately 4 years ago and developed without any specific cause or injury. She reports that the pain flairs occasionally and the symptoms have worsened over the last few months. The pain  is localized to the anterior shoulder. She tried taking meloxicam regularly for a couple weeks without any notable improvement. At one point she was exercising at the gym three times/week however now working out in the gym irritates her shoulder. She also gets pain if she reaches behind her back, reaches overhead, or rolls onto her R side while sleeping (wakes her up). She denies any neck pain, numbness/tingling, or focal weakness in RUE. She is R hand dominant. Pt saw a chiropractor for 6 visits without improvement. She had an appointment with Dr. Roland Rack who referred her for physical therapy for R rotator cuff tendinitis. She reports that he offered a steroid injection however she is a diabetic is concerned about an increase in her blood sugar. She had plain films of her R shoulder which demonstrated no evidence for fractures, lytic lesions, or significant degenerative changes. The subacromial space is well-maintained. There is no subacromial or infra-clavicular spurring. She demonstrates a Type I acromion.   Pain:  Pain Intensity: Present: 0/10, Best: 0/10, Worst: 6/10 Pain location: Anterior and anterolateral R shoulder Pain Quality:  pressure, deep ache   Radiating: No  Numbness/Tingling: No Focal Weakness: No Aggravating factors: reaching behind the back, overhead motion pain with wall  push-up, rolling onto R side;  Relieving factors: stop the activity, ibuprofen 24-hour pain behavior: no change throughout the day, activity dependent History of prior shoulder or neck/shoulder injury, pain, surgery, or therapy: No, she did see a chiropractor for 6 visits without improvement Falls: Has patient fallen in last 6 months? 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  Recent memory is intact.  Remote memory is intact.  Attention span and concentration are intact.  Expressive speech is intact.  Patient's fund of knowledge is within normal limits for educational level.    Gross Musculoskeletal Assessment Tremor: None Bulk: Normal Tone: Normal No edema, ecchymosis, or erythema noted around R shoulder;  Gait Deferred  Posture Forward head and rounded shoulders in sitting position. Partially able to correct with cues. In supine both shoulder protract anteriorly  Cervical Screen AROM: WFL and painless with overpressure in all planes Spurlings A (ipsilateral lateral flexion/axial compression): R: Negative L: Negative Spurlings B (ipsilateral lateral flexion/contralateral rotation/axial compression): R: Negative L: Negative Repeated movement: No centralization or peripheralization with repeated  retraction Hoffman Sign (cervical cord compression): R: Not tested L: Not tested ULTT Median: R: Not examined L: Not examined ULTT Ulnar: R: Not examined L: Not examined ULTT Radial: R: Not examined L: Not examined   AROM  AROM (Normal range in degrees) AROM 08/04/2022  Cervical  Flexion (50) WNL  Extension (80) WNL  Right lateral flexion (45) WNL  Left lateral flexion (45) WNL  Right rotation (85) WNL  Left rotation (85) WNL   Right Left  Shoulder    Flexion 150 (Pain starts at 115) 170  Extension    Abduction 135 175  External Rotation 72 85  Internal Rotation 45 70  Hands Behind Head T3 T3  Hands Behind Back Belt line T5      Elbow    Flexion WNL WNL  Extension WNL WNL  Pronation WNL WNL  Supination WNL WNL  (* = pain; Blank rows = not tested)   LE MMT:  MMT (out of 5) Right 08/04/2022 Left 08/04/2022  Cervical (isometric)  Flexion WNL  Extension WNL  Lateral Flexion WNL WNL  Rotation WNL WNL      Shoulder   Flexion 5* 5  Extension 5 5  Abduction 5* 5  External rotation 5 5  Internal rotation 5 5  Horizontal abduction 4+ 4+  Horizontal adduction    Lower Trapezius 3+* 3+  Rhomboids 5 5      Elbow  Flexion 5 5  Extension 5 5  Pronation    Supination        Wrist  Flexion 5 5  Extension 5 5  Radial deviation    Ulnar deviation        MCP  Flexion 5 5  Extension 5 5  Abduction 5 5  Adduction 5 5  (* = pain; Blank rows = not tested)  Sensation Grossly intact to light touch bilateral UE as determined by testing dermatomes C2-T2. Proprioception and hot/cold testing deferred on this date.  Reflexes Deferred  Palpation  Location LEFT  RIGHT           Subocciptials  0  Cervical paraspinals  0  Upper Trapezius  0  Levator Scapulae  0  Rhomboid Major/Minor    Sternoclavicular joint    Acromioclavicular joint  0  Coracoid process  0  Long head of biceps  1  Supraspinatus  0  Infraspinatus  0  Subscapularis  0  Teres Minor  0   Teres Major  0  Pectoralis Major  0  Pectoralis Minor    Anterior Deltoid  0  Lateral Deltoid  0  Posterior Deltoid  0  Latissimus Dorsi  0  Sternocleidomastoid    (Blank rows = not tested) Graded on 0-4 scale (0 = no pain, 1 = pain, 2 = pain with wincing/grimacing/flinching, 3 = pain with withdrawal, 4 = unwilling to allow palpation), (Blank rows = not tested)  Repeated Movements No centralization or peripheralization of symptoms with repeated cervical retraction.   Passive Accessory Intervertebral Motion Pt denies reproduction of shoulder pain with CPA C2-T7 and UPA bilaterally C2-T7. Mild tenderness centrally in cervical spine with pressure but no R shoulder pain. Grossly hypomobile throughout thoracic spine;  Accessory Motions/Glides Glenohumeral: Posterior: R: abnormal L: normal Inferior: R: normal L: normal Anterior: R: normal L: normal  Acromioclavicular:  Posterior: R: normal L: normal Anterior: R: normal L: normal  Sternoclavicular: Posterior: R: normal L: normal Anterior: R: normal L: normal Superior: R: normal L: normal Inferior: R: normal L: normal  Scapulothoracic: Not examined  Muscle Length Testing Not examined   SPECIAL TESTS Rotator Cuff  Drop Arm Test: Negative Painful Arc (Pain from 60 to 120 degrees scaption): Negative Infraspinatus Muscle Test: Negative  Subacromial Impingement Hawkins-Kennedy: Positive Neer (Block scapula, PROM flexion): Positive Painful Arc (Pain from 60 to 120 degrees scaption): Negative (pt has pain at 115 to end range) Empty Can: Negative External Rotation Resistance: Negative Horizontal Adduction: Not examined Scapular Assist: Positive for decrease in pain  Labral Tear Biceps Load II (120 elevation, full ER, 90 elbow flexion, full supination, resisted elbow flexion): Negative Crank (  160 scaption, axial load with IR/ER): Negative Active Compression Test: Not examined  Bicep Tendon Pathology Speed (shoulder  flexion to 90, external rotation, full elbow extension, and forearm supination with resistance: Negative Yergason's (resisted shoulder ER and supination/biceps tendon pathology): Negative  Shoulder Instability Sulcus Sign: Negative Anterior Apprehension: Negative  Beighton scale: Deferred   TODAY'S TREATMENT    SUBJECTIVE: Pt reports continued pain and limitation with her R shoulder. She continues to experience pain with reaching. She also continues experiencing pain when lying on her R shoulder at night due to pain in R shoulder. She has started having vertigo again. She had 3 weeks with full resolution and now it has recurred.    PAIN: Denies resting pain;   Ther-ex  UBE x 4 minutes for warm-up during interval history (2 minutes forward/2 minutes backward) Supine R shoulder flexion to 90 degrees with 3# dumbbell (DB) x 10; Supine R shoulder serratus punch with 3# DB x 10; L sidelying R shoulder abduction with 3# DB x 10; L sidelying R shoulder ER wth 3# DB x 10; Plan of care updated with patient and discharge instructions/follow-up education provided;   Manual Therapy  Supine R GH distraction progressing through passive abduction ROM; Supine R GH AP mobilizations at neutral, grade II-III (pain limited) 30s/bout x 2 bouts; Supine R GH inferior mobilizations at 90 abduction, grade II-III (pain limited) 30s/bout x 2 bouts; Supine R GH AP mobilizations at 90 abduction and available end range ER, grade II-III (pain limited) 30s/bout x 2 bouts; Supine R GH AP mobilizations at 90 flexion through long axis, grade II-III, 30s/bout x 2 bouts;   Neuromuscular Re-education  Negative L Dix-Hallpike Test for both vertigo and nystagmus. Positive R Dix-Hallpike test for mild upbeating R torsional nystagmus with concurrent mild vertigo which lasts for approximately 6-8 seconds. Findings consistent with R posterior canal BPPV. Education provided regarding home Epley maneuver.   PATIENT  EDUCATION:  Education details: Form/technique with exercise. Person educated: Patient Education method: Explanation, verbal cues, tactile cues Education comprehension: verbalized understanding and returned demonstration   HOME EXERCISE PROGRAM: Access Code: [email or web address] URL: [email or web address] Date: 06/15/2022 Prepared by: Roxana Hires  Exercises - Seated Isometric Shoulder Inferior Glide  - 1 x daily - 7 x weekly - 2 sets - 10 reps - 3s hold - Dynamic Hug with Resistance  - 1 x daily - 7 x weekly - 2 sets - 10 reps - 3s hold - Standing Low Shoulder Row with Anchored Resistance  - 1 x daily - 7 x weekly - 2 sets - 10 reps - 3s hold - Standing Shoulder Internal Rotation Stretch with Towel  - 2 x daily - 7 x weekly - 3 reps - 30s hold - Seated Shoulder Flexion Table Top Stretch  - 2 x daily - 7 x weekly - 3 reps - 30s hold - Single Arm Doorway Pec Stretch at 90 Degrees Abduction  - 2 x daily - 7 x weekly - 3 reps - 30s hold - Seated Shoulder Flexion AAROM with Pulley Behind (Mirrored)  - 2 x daily - 7 x weekly - 2 sets - 10 reps - Seated Shoulder Scaption AAROM with Pulley at Side (Mirrored)  - 2 x daily - 7 x weekly - 2 sets - 10 reps - Seated Shoulder Internal Rotation AAROM with Pulley  - 2 x daily - 7 x weekly - 2 sets - 10 reps - Self-Epley Maneuver Right Ear  - 1 x daily - 7 x  weekly - hold for 60s in each position hold  Patient Education - Shoulder Impingement - BPPV  ASSESSMENT:  CLINICAL IMPRESSION: Patient demonstrates excellent motivation during session today. Due to ongoing vertigo performed testing for BPPV again today. Negative L Dix-Hallpike Test for both vertigo and nystagmus. Positive R Dix-Hallpike test for mild upbeating R torsional nystagmus with concurrent mild vertigo which lasts for approximately 6-8 seconds. Findings consistent with R posterior canal BPPV. Education provided regarding home Epley maneuver. Continued manual techniques as well as R  upper quarter strengthening with patient during appointment today. Pt reports that she is would like to discharge from therapy on this date. She has not achieved significant improvement in her shoulder symptoms since starting with therapy however her episode was interrupted/complicated by BPPV treatment. Reinforced importance of home program to patient especially stretches. Pt encouraged to follow-up with orthopedist to discuss plan of care. She may need imaging given duration of symptoms of >4 years and failure to respond to conservative management with PT. She will be discharged on this date.   REHAB POTENTIAL: Excellent  CLINICAL DECISION MAKING: Stable/uncomplicated  EVALUATION COMPLEXITY: Low   GOALS: Goals reviewed with patient? Yes  SHORT TERM GOALS: Target date: 06/30/2022  Pt will be independent with HEP to improve strength and decrease neck pain to improve pain-free function at home and work. Baseline: 07/11/2022, 2x/week Goal status: ON-GOING   LONG TERM GOALS: Target date: 07/28/2022  Pt will increase FOTO to at least 57 to demonstrate significant improvement in function at home and work related to neck pain  Baseline: 06/02/22: 39 Goal status: INITIAL  2.  Pt will decrease worst shoulder pain by at least 3 points on the NPRS in order to demonstrate clinically significant reduction in shoulder pain. Baseline: 06/02/22: worst: 6/10; 07/11/2022 up to 6/10 still.  Goal status: ON- GOING  3.  Pt will decrease quick DASH score by at least 8% in order to demonstrate clinically significant reduction in disability related to shoulder pain        Baseline: 06/02/22: To be completed; 06/06/22: 13.6%; 07/11/22: 22.36% Goal status: NOT MET  4. Pt will increase strength of R low trap by at least 1/2 MMT grade in order to demonstrate improvement in strength and function         Baseline: 06/02/22: 3+/5; 07/11/2022: 4/5  Goal status: ACHIEVED  5. Pt will increase R shoulder flexion, abduction and  IR AROM by at least 15 degrees each in order to restore normal shoulder motion to prevent pain with overhead and behind the back movements Baseline: 06/02/22: R shoulder: flexion: 150, Abduction: 135, IR: 45; 07/04/22: R shoulder: flexion: 154, Abduction: 140, ER: 69, IR: 62; Goal status: PARTIALLY MET   PLAN: PT FREQUENCY: 1x/week  PT DURATION: 1 week  PLANNED INTERVENTIONS: Therapeutic exercises, Therapeutic activity, Neuromuscular re-education, Balance training, Gait training, Patient/Family education, Joint manipulation, Joint mobilization, Vestibular training, Canalith repositioning, Aquatic Therapy, Dry Needling, Electrical stimulation, Spinal manipulation, Spinal mobilization, Cryotherapy, Moist heat, Traction, Ultrasound, Ionotophoresis 24m/ml Dexamethasone, and Manual therapy  PLAN FOR NEXT SESSION:  Discharge  JPhillips GroutPT, DPT, GCS  Physical Therapist- [facility] 08/04/2022, 9:29 AM

## 2022-08-04 ENCOUNTER — Other Ambulatory Visit: Payer: Self-pay

## 2022-08-28 ENCOUNTER — Other Ambulatory Visit: Payer: Self-pay

## 2022-08-28 MED ORDER — ESOMEPRAZOLE MAGNESIUM 40 MG PO CPDR
DELAYED_RELEASE_CAPSULE | ORAL | 1 refills | Status: DC
Start: 2022-08-28 — End: 2023-03-22
  Filled 2022-08-28: qty 180, 90d supply, fill #0
  Filled 2022-12-25 (×2): qty 180, 90d supply, fill #1

## 2022-08-28 MED ORDER — METOPROLOL SUCCINATE ER 25 MG PO TB24
ORAL_TABLET | ORAL | 1 refills | Status: DC
  Filled 2022-08-28: qty 90, 90d supply, fill #0
  Filled 2023-02-14: qty 90, 90d supply, fill #1

## 2022-08-31 DIAGNOSIS — E1165 Type 2 diabetes mellitus with hyperglycemia: Secondary | ICD-10-CM | POA: Diagnosis not present

## 2022-08-31 DIAGNOSIS — K219 Gastro-esophageal reflux disease without esophagitis: Secondary | ICD-10-CM | POA: Diagnosis not present

## 2022-08-31 DIAGNOSIS — I10 Essential (primary) hypertension: Secondary | ICD-10-CM | POA: Diagnosis not present

## 2022-08-31 DIAGNOSIS — M25511 Pain in right shoulder: Secondary | ICD-10-CM | POA: Diagnosis not present

## 2022-09-04 ENCOUNTER — Other Ambulatory Visit: Payer: Self-pay

## 2022-09-04 DIAGNOSIS — M542 Cervicalgia: Secondary | ICD-10-CM | POA: Diagnosis not present

## 2022-09-04 DIAGNOSIS — J209 Acute bronchitis, unspecified: Secondary | ICD-10-CM | POA: Diagnosis not present

## 2022-09-04 DIAGNOSIS — J019 Acute sinusitis, unspecified: Secondary | ICD-10-CM | POA: Diagnosis not present

## 2022-09-04 DIAGNOSIS — Z03818 Encounter for observation for suspected exposure to other biological agents ruled out: Secondary | ICD-10-CM | POA: Diagnosis not present

## 2022-09-04 DIAGNOSIS — B9689 Other specified bacterial agents as the cause of diseases classified elsewhere: Secondary | ICD-10-CM | POA: Diagnosis not present

## 2022-09-04 DIAGNOSIS — B3731 Acute candidiasis of vulva and vagina: Secondary | ICD-10-CM | POA: Diagnosis not present

## 2022-09-04 MED ORDER — AZITHROMYCIN 250 MG PO TABS
ORAL_TABLET | ORAL | 0 refills | Status: AC
  Filled 2022-09-04: qty 6, 5d supply, fill #0

## 2022-09-04 MED ORDER — FLUCONAZOLE 150 MG PO TABS: ORAL_TABLET | ORAL | 2 refills | Status: AC

## 2022-09-04 MED ORDER — CYCLOBENZAPRINE HCL 10 MG PO TABS
ORAL_TABLET | ORAL | 0 refills | Status: AC
  Filled 2022-09-04: qty 20, 10d supply, fill #0

## 2022-09-04 MED ORDER — HYDROCODONE-ACETAMINOPHEN 5-325 MG PO TABS
ORAL_TABLET | ORAL | 0 refills | Status: AC
  Filled 2022-09-04: qty 12, 3d supply, fill #0

## 2022-09-04 MED ORDER — PREDNISONE 20 MG PO TABS
ORAL_TABLET | ORAL | 0 refills | Status: AC
Start: 2022-09-04 — End: ?
  Filled 2022-09-04: qty 10, 5d supply, fill #0

## 2022-09-04 MED ORDER — BENZONATATE 200 MG PO CAPS
ORAL_CAPSULE | ORAL | 0 refills | Status: AC
  Filled 2022-09-04: qty 20, 7d supply, fill #0

## 2022-09-07 ENCOUNTER — Other Ambulatory Visit: Payer: Self-pay

## 2022-09-07 DIAGNOSIS — E786 Lipoprotein deficiency: Secondary | ICD-10-CM | POA: Diagnosis not present

## 2022-09-07 DIAGNOSIS — I10 Essential (primary) hypertension: Secondary | ICD-10-CM | POA: Diagnosis not present

## 2022-09-07 DIAGNOSIS — J209 Acute bronchitis, unspecified: Secondary | ICD-10-CM | POA: Diagnosis not present

## 2022-09-07 DIAGNOSIS — E1165 Type 2 diabetes mellitus with hyperglycemia: Secondary | ICD-10-CM | POA: Diagnosis not present

## 2022-09-07 MED ORDER — ALBUTEROL SULFATE HFA 108 (90 BASE) MCG/ACT IN AERS
INHALATION_SPRAY | RESPIRATORY_TRACT | 1 refills | Status: DC
  Filled 2022-09-07: qty 6.7, 25d supply, fill #0

## 2022-09-07 MED ORDER — CEFUROXIME AXETIL 250 MG PO TABS
ORAL_TABLET | ORAL | 0 refills | Status: AC
  Filled 2022-09-07: qty 14, 7d supply, fill #0
  Filled 2022-09-07: qty 6, 3d supply, fill #0

## 2022-09-08 ENCOUNTER — Other Ambulatory Visit: Payer: Self-pay

## 2022-09-29 ENCOUNTER — Other Ambulatory Visit: Payer: Self-pay

## 2022-10-27 ENCOUNTER — Other Ambulatory Visit: Payer: Self-pay

## 2022-10-31 ENCOUNTER — Other Ambulatory Visit: Payer: Self-pay

## 2022-10-31 MED ORDER — PHENDIMETRAZINE TARTRATE 35 MG PO TABS
35.0000 mg | ORAL_TABLET | Freq: Three times a day (TID) | ORAL | 0 refills | Status: DC
  Filled 2022-10-31: qty 90, 30d supply, fill #0

## 2022-11-01 ENCOUNTER — Other Ambulatory Visit: Payer: Self-pay

## 2022-11-03 ENCOUNTER — Other Ambulatory Visit: Payer: Self-pay

## 2022-11-03 DIAGNOSIS — I10 Essential (primary) hypertension: Secondary | ICD-10-CM | POA: Diagnosis not present

## 2022-11-03 DIAGNOSIS — J40 Bronchitis, not specified as acute or chronic: Secondary | ICD-10-CM | POA: Diagnosis not present

## 2022-11-03 DIAGNOSIS — E1165 Type 2 diabetes mellitus with hyperglycemia: Secondary | ICD-10-CM | POA: Diagnosis not present

## 2022-11-03 MED ORDER — GLIPIZIDE 10 MG PO TABS
ORAL_TABLET | ORAL | 1 refills | Status: DC
  Filled 2022-11-03: qty 180, 90d supply, fill #0
  Filled 2023-02-14: qty 180, 90d supply, fill #1

## 2022-11-03 MED ORDER — AMOXICILLIN-POT CLAVULANATE 875-125 MG PO TABS
ORAL_TABLET | ORAL | 0 refills | Status: DC
  Filled 2022-11-03: qty 14, 7d supply, fill #0

## 2022-11-03 MED ORDER — METFORMIN HCL 1000 MG PO TABS
ORAL_TABLET | ORAL | 1 refills | Status: DC
  Filled 2022-11-03: qty 180, 90d supply, fill #0
  Filled 2023-02-14: qty 180, 90d supply, fill #1

## 2022-11-24 ENCOUNTER — Other Ambulatory Visit: Payer: Self-pay

## 2022-11-24 MED ORDER — ATORVASTATIN CALCIUM 10 MG PO TABS
10.0000 mg | ORAL_TABLET | Freq: Every day | ORAL | 1 refills | Status: DC
  Filled 2022-11-24: qty 90, 90d supply, fill #0
  Filled 2023-02-14: qty 90, 90d supply, fill #1

## 2022-12-25 ENCOUNTER — Other Ambulatory Visit: Payer: Self-pay

## 2022-12-27 ENCOUNTER — Other Ambulatory Visit: Payer: Self-pay

## 2022-12-27 MED ORDER — ONDANSETRON HCL 4 MG PO TABS
4.0000 mg | ORAL_TABLET | Freq: Two times a day (BID) | ORAL | 1 refills | Status: AC | PRN
  Filled 2022-12-27: qty 30, 15d supply, fill #0
  Filled 2023-08-08: qty 30, 15d supply, fill #1

## 2022-12-27 MED ORDER — EMPAGLIFLOZIN 10 MG PO TABS
10.0000 mg | ORAL_TABLET | Freq: Every day | ORAL | 1 refills | Status: DC
  Filled 2022-12-27: qty 90, 90d supply, fill #0
  Filled 2023-04-03: qty 90, 90d supply, fill #1

## 2022-12-27 MED ORDER — LOSARTAN POTASSIUM 25 MG PO TABS
25.0000 mg | ORAL_TABLET | Freq: Every day | ORAL | 1 refills | Status: DC
  Filled 2022-12-27: qty 90, 90d supply, fill #0
  Filled 2023-04-03: qty 90, 90d supply, fill #1

## 2022-12-27 MED ORDER — ALPRAZOLAM 0.25 MG PO TABS
0.2500 mg | ORAL_TABLET | Freq: Every evening | ORAL | 2 refills | Status: AC
  Filled 2022-12-27: qty 30, 30d supply, fill #0
  Filled 2023-02-14: qty 30, 30d supply, fill #1
  Filled 2023-03-22: qty 30, 30d supply, fill #2

## 2022-12-27 MED ORDER — SEMAGLUTIDE (1 MG/DOSE) 4 MG/3ML ~~LOC~~ SOPN
1.0000 mg | PEN_INJECTOR | SUBCUTANEOUS | 5 refills | Status: DC
  Filled 2022-12-27: qty 3, 28d supply, fill #0
  Filled 2023-01-22: qty 3, 28d supply, fill #1
  Filled 2023-02-14: qty 3, 28d supply, fill #2
  Filled 2023-03-22: qty 3, 28d supply, fill #3
  Filled 2023-04-19: qty 3, 28d supply, fill #4

## 2023-01-02 ENCOUNTER — Other Ambulatory Visit: Payer: Self-pay

## 2023-01-02 DIAGNOSIS — Z008 Encounter for other general examination: Secondary | ICD-10-CM | POA: Diagnosis not present

## 2023-01-02 MED ORDER — PHENDIMETRAZINE TARTRATE 35 MG PO TABS
35.0000 mg | ORAL_TABLET | Freq: Three times a day (TID) | ORAL | 0 refills | Status: DC
  Filled 2023-01-02: qty 90, 30d supply, fill #0

## 2023-01-03 ENCOUNTER — Other Ambulatory Visit: Payer: Self-pay

## 2023-01-04 ENCOUNTER — Other Ambulatory Visit: Payer: Self-pay

## 2023-01-08 DIAGNOSIS — J209 Acute bronchitis, unspecified: Secondary | ICD-10-CM | POA: Diagnosis not present

## 2023-01-08 DIAGNOSIS — E786 Lipoprotein deficiency: Secondary | ICD-10-CM | POA: Diagnosis not present

## 2023-01-08 DIAGNOSIS — I10 Essential (primary) hypertension: Secondary | ICD-10-CM | POA: Diagnosis not present

## 2023-01-08 DIAGNOSIS — E1165 Type 2 diabetes mellitus with hyperglycemia: Secondary | ICD-10-CM | POA: Diagnosis not present

## 2023-01-09 ENCOUNTER — Other Ambulatory Visit: Payer: Self-pay

## 2023-01-09 DIAGNOSIS — B49 Unspecified mycosis: Secondary | ICD-10-CM | POA: Diagnosis not present

## 2023-01-09 DIAGNOSIS — I10 Essential (primary) hypertension: Secondary | ICD-10-CM | POA: Diagnosis not present

## 2023-01-09 DIAGNOSIS — E1165 Type 2 diabetes mellitus with hyperglycemia: Secondary | ICD-10-CM | POA: Diagnosis not present

## 2023-01-09 DIAGNOSIS — K5909 Other constipation: Secondary | ICD-10-CM | POA: Diagnosis not present

## 2023-01-09 DIAGNOSIS — K219 Gastro-esophageal reflux disease without esophagitis: Secondary | ICD-10-CM | POA: Diagnosis not present

## 2023-01-09 MED ORDER — FLUCONAZOLE 100 MG PO TABS
100.0000 mg | ORAL_TABLET | Freq: Every day | ORAL | 0 refills | Status: AC
  Filled 2023-01-09: qty 14, 14d supply, fill #0

## 2023-01-09 MED ORDER — LINZESS 72 MCG PO CAPS
72.0000 ug | ORAL_CAPSULE | Freq: Every day | ORAL | 3 refills | Status: DC | PRN
  Filled 2023-01-09: qty 30, 30d supply, fill #0
  Filled 2023-04-23: qty 30, 30d supply, fill #1

## 2023-01-18 ENCOUNTER — Other Ambulatory Visit: Payer: Self-pay

## 2023-01-22 ENCOUNTER — Other Ambulatory Visit: Payer: Self-pay

## 2023-02-01 ENCOUNTER — Other Ambulatory Visit: Payer: Self-pay

## 2023-02-01 DIAGNOSIS — Z008 Encounter for other general examination: Secondary | ICD-10-CM | POA: Diagnosis not present

## 2023-02-01 MED ORDER — PHENDIMETRAZINE TARTRATE 35 MG PO TABS
35.0000 mg | ORAL_TABLET | Freq: Three times a day (TID) | ORAL | 0 refills | Status: DC
  Filled 2023-02-01: qty 90, 30d supply, fill #0

## 2023-02-02 ENCOUNTER — Other Ambulatory Visit: Payer: Self-pay

## 2023-02-05 ENCOUNTER — Other Ambulatory Visit: Payer: Self-pay

## 2023-02-14 ENCOUNTER — Other Ambulatory Visit: Payer: Self-pay

## 2023-02-15 ENCOUNTER — Other Ambulatory Visit: Payer: Self-pay

## 2023-02-15 MED ORDER — ALPRAZOLAM 0.25 MG PO TABS
0.2500 mg | ORAL_TABLET | Freq: Every evening | ORAL | 2 refills | Status: AC | PRN
  Filled 2023-02-15 – 2023-06-11 (×2): qty 30, 30d supply, fill #0

## 2023-02-20 ENCOUNTER — Other Ambulatory Visit: Payer: Self-pay

## 2023-03-22 ENCOUNTER — Other Ambulatory Visit: Payer: Self-pay

## 2023-03-22 MED ORDER — ESOMEPRAZOLE MAGNESIUM 40 MG PO CPDR
40.0000 mg | DELAYED_RELEASE_CAPSULE | Freq: Two times a day (BID) | ORAL | 1 refills | Status: DC
  Filled 2023-03-22: qty 180, 90d supply, fill #0
  Filled 2023-06-29: qty 180, 90d supply, fill #1

## 2023-04-03 ENCOUNTER — Other Ambulatory Visit: Payer: Self-pay

## 2023-04-23 ENCOUNTER — Other Ambulatory Visit: Payer: Self-pay

## 2023-04-25 DIAGNOSIS — H04123 Dry eye syndrome of bilateral lacrimal glands: Secondary | ICD-10-CM | POA: Diagnosis not present

## 2023-04-25 DIAGNOSIS — E113293 Type 2 diabetes mellitus with mild nonproliferative diabetic retinopathy without macular edema, bilateral: Secondary | ICD-10-CM | POA: Diagnosis not present

## 2023-04-25 DIAGNOSIS — H40053 Ocular hypertension, bilateral: Secondary | ICD-10-CM | POA: Diagnosis not present

## 2023-05-07 DIAGNOSIS — K219 Gastro-esophageal reflux disease without esophagitis: Secondary | ICD-10-CM | POA: Diagnosis not present

## 2023-05-07 DIAGNOSIS — I10 Essential (primary) hypertension: Secondary | ICD-10-CM | POA: Diagnosis not present

## 2023-05-07 DIAGNOSIS — B49 Unspecified mycosis: Secondary | ICD-10-CM | POA: Diagnosis not present

## 2023-05-07 DIAGNOSIS — E1165 Type 2 diabetes mellitus with hyperglycemia: Secondary | ICD-10-CM | POA: Diagnosis not present

## 2023-05-14 ENCOUNTER — Other Ambulatory Visit: Payer: Self-pay

## 2023-05-14 DIAGNOSIS — I10 Essential (primary) hypertension: Secondary | ICD-10-CM | POA: Diagnosis not present

## 2023-05-14 DIAGNOSIS — K219 Gastro-esophageal reflux disease without esophagitis: Secondary | ICD-10-CM | POA: Diagnosis not present

## 2023-05-14 DIAGNOSIS — B372 Candidiasis of skin and nail: Secondary | ICD-10-CM | POA: Diagnosis not present

## 2023-05-14 DIAGNOSIS — Z1331 Encounter for screening for depression: Secondary | ICD-10-CM | POA: Diagnosis not present

## 2023-05-14 DIAGNOSIS — Z0000 Encounter for general adult medical examination without abnormal findings: Secondary | ICD-10-CM | POA: Diagnosis not present

## 2023-05-14 DIAGNOSIS — E1165 Type 2 diabetes mellitus with hyperglycemia: Secondary | ICD-10-CM | POA: Diagnosis not present

## 2023-05-14 MED ORDER — PHENDIMETRAZINE TARTRATE 35 MG PO TABS
35.0000 mg | ORAL_TABLET | Freq: Three times a day (TID) | ORAL | 3 refills | Status: DC
  Filled 2023-05-14: qty 80, 27d supply, fill #0
  Filled 2023-05-16: qty 10, 3d supply, fill #0
  Filled 2023-06-11: qty 90, 30d supply, fill #1
  Filled 2023-07-13: qty 90, 30d supply, fill #2
  Filled 2023-08-08: qty 90, 30d supply, fill #3

## 2023-05-14 MED ORDER — LUBIPROSTONE 24 MCG PO CAPS
ORAL_CAPSULE | ORAL | 3 refills | Status: DC
  Filled 2023-05-14: qty 60, 30d supply, fill #0
  Filled 2023-06-11: qty 60, 30d supply, fill #1

## 2023-05-14 MED ORDER — OZEMPIC (2 MG/DOSE) 8 MG/3ML ~~LOC~~ SOPN
2.0000 mg | PEN_INJECTOR | SUBCUTANEOUS | 5 refills | Status: DC
  Filled 2023-05-14: qty 3, 28d supply, fill #0
  Filled 2023-06-11: qty 3, 28d supply, fill #1
  Filled 2023-06-29: qty 3, 28d supply, fill #2
  Filled 2023-08-08: qty 3, 28d supply, fill #3
  Filled 2023-09-14: qty 3, 28d supply, fill #4
  Filled 2023-10-07: qty 3, 28d supply, fill #5

## 2023-05-14 MED ORDER — FLUCONAZOLE 100 MG PO TABS
100.0000 mg | ORAL_TABLET | Freq: Every day | ORAL | 1 refills | Status: AC
  Filled 2023-05-14: qty 14, 14d supply, fill #0
  Filled 2023-06-29: qty 14, 14d supply, fill #1

## 2023-05-15 ENCOUNTER — Other Ambulatory Visit: Payer: Self-pay

## 2023-05-16 ENCOUNTER — Other Ambulatory Visit: Payer: Self-pay

## 2023-06-08 ENCOUNTER — Other Ambulatory Visit: Payer: Self-pay | Admitting: Oncology

## 2023-06-08 DIAGNOSIS — Z006 Encounter for examination for normal comparison and control in clinical research program: Secondary | ICD-10-CM

## 2023-06-11 ENCOUNTER — Other Ambulatory Visit: Payer: Self-pay

## 2023-06-11 MED ORDER — ATORVASTATIN CALCIUM 10 MG PO TABS
10.0000 mg | ORAL_TABLET | Freq: Every day | ORAL | 1 refills | Status: AC
  Filled 2023-06-11: qty 90, 90d supply, fill #0

## 2023-06-11 MED ORDER — GLUCOSE BLOOD VI STRP
ORAL_STRIP | Freq: Two times a day (BID) | 1 refills | Status: AC
  Filled 2023-06-11: qty 200, 90d supply, fill #0

## 2023-06-11 MED ORDER — METFORMIN HCL 1000 MG PO TABS
1000.0000 mg | ORAL_TABLET | Freq: Two times a day (BID) | ORAL | 1 refills | Status: AC
  Filled 2023-06-11: qty 180, 90d supply, fill #0

## 2023-06-11 MED ORDER — EMPAGLIFLOZIN 10 MG PO TABS
10.0000 mg | ORAL_TABLET | Freq: Every day | ORAL | 1 refills | Status: AC
  Filled 2023-06-11: qty 90, 90d supply, fill #0

## 2023-06-11 MED ORDER — GLIPIZIDE 10 MG PO TABS
10.0000 mg | ORAL_TABLET | Freq: Two times a day (BID) | ORAL | 1 refills | Status: AC
  Filled 2023-06-11: qty 180, 90d supply, fill #0

## 2023-06-13 ENCOUNTER — Other Ambulatory Visit
Admission: RE | Admit: 2023-06-13 | Discharge: 2023-06-13 | Disposition: A | Discharge status: Home / Self Care | Payer: Self-pay | Attending: Oncology | Admitting: Oncology

## 2023-06-13 DIAGNOSIS — Z006 Encounter for examination for normal comparison and control in clinical research program: Secondary | ICD-10-CM | POA: Insufficient documentation

## 2023-06-28 LAB — HELIX MOLECULAR SCREEN

## 2023-06-29 ENCOUNTER — Other Ambulatory Visit: Payer: Self-pay

## 2023-06-29 MED ORDER — LOSARTAN POTASSIUM 25 MG PO TABS
25.0000 mg | ORAL_TABLET | Freq: Every day | ORAL | 1 refills | Status: AC
  Filled 2023-06-29: qty 90, 90d supply, fill #0

## 2023-06-29 MED ORDER — METOPROLOL SUCCINATE ER 25 MG PO TB24
25.0000 mg | ORAL_TABLET | Freq: Every day | ORAL | 1 refills | Status: DC
  Filled 2023-06-29: qty 90, 90d supply, fill #0

## 2023-07-03 ENCOUNTER — Other Ambulatory Visit: Payer: Self-pay | Admitting: Internal Medicine

## 2023-07-03 DIAGNOSIS — Z1231 Encounter for screening mammogram for malignant neoplasm of breast: Secondary | ICD-10-CM

## 2023-07-04 ENCOUNTER — Other Ambulatory Visit: Payer: Self-pay

## 2023-07-13 ENCOUNTER — Other Ambulatory Visit: Payer: Self-pay

## 2023-07-16 ENCOUNTER — Other Ambulatory Visit: Payer: Self-pay

## 2023-07-23 ENCOUNTER — Ambulatory Visit
Admission: RE | Admit: 2023-07-23 | Discharge: 2023-07-23 | Disposition: A | Discharge status: Home / Self Care | Payer: 59 | Source: Ambulatory Visit | Attending: Internal Medicine | Admitting: Internal Medicine

## 2023-07-23 DIAGNOSIS — Z1231 Encounter for screening mammogram for malignant neoplasm of breast: Secondary | ICD-10-CM | POA: Diagnosis not present

## 2023-07-30 ENCOUNTER — Ambulatory Visit (INDEPENDENT_AMBULATORY_CARE_PROVIDER_SITE_OTHER): Payer: 59 | Admitting: Dermatology

## 2023-07-30 ENCOUNTER — Other Ambulatory Visit: Payer: Self-pay

## 2023-07-30 ENCOUNTER — Encounter: Payer: Self-pay | Admitting: Dermatology

## 2023-07-30 VITALS — BP 129/79 | HR 88

## 2023-07-30 DIAGNOSIS — L821 Other seborrheic keratosis: Secondary | ICD-10-CM

## 2023-07-30 DIAGNOSIS — L304 Erythema intertrigo: Secondary | ICD-10-CM | POA: Diagnosis not present

## 2023-07-30 DIAGNOSIS — W908XXA Exposure to other nonionizing radiation, initial encounter: Secondary | ICD-10-CM

## 2023-07-30 DIAGNOSIS — D1801 Hemangioma of skin and subcutaneous tissue: Secondary | ICD-10-CM

## 2023-07-30 DIAGNOSIS — L578 Other skin changes due to chronic exposure to nonionizing radiation: Secondary | ICD-10-CM

## 2023-07-30 DIAGNOSIS — Z1283 Encounter for screening for malignant neoplasm of skin: Secondary | ICD-10-CM

## 2023-07-30 DIAGNOSIS — L814 Other melanin hyperpigmentation: Secondary | ICD-10-CM

## 2023-07-30 DIAGNOSIS — D229 Melanocytic nevi, unspecified: Secondary | ICD-10-CM

## 2023-07-30 DIAGNOSIS — D492 Neoplasm of unspecified behavior of bone, soft tissue, and skin: Secondary | ICD-10-CM | POA: Diagnosis not present

## 2023-07-30 MED ORDER — HYDROCORTISONE 2.5 % EX CREA
TOPICAL_CREAM | CUTANEOUS | 11 refills | Status: AC
  Filled 2023-07-30: qty 30, 14d supply, fill #0
  Filled 2023-10-07: qty 30, 14d supply, fill #1
  Filled 2024-04-22: qty 30, 14d supply, fill #2

## 2023-07-30 MED ORDER — KETOCONAZOLE 2 % EX CREA
TOPICAL_CREAM | CUTANEOUS | 11 refills | Status: AC
Start: 2023-07-30 — End: ?
  Filled 2023-07-30: qty 60, 30d supply, fill #0

## 2023-07-30 NOTE — Progress Notes (Signed)
Follow-Up Visit   Subjective  Tonya Sampson is a 47 y.o. female who presents for the following: Skin Cancer Screening and Full Body Skin Exam Reports recurrent rash at abdominal folds was prescribed nystatin powder by pcp but rash persist would like to discuss other treatment.     The patient presents for Total-Body Skin Exam (TBSE) for skin cancer screening and mole check. The patient has spots, moles and lesions to be evaluated, some may be new or changing and the patient may have concern these could be cancer.    The following portions of the chart were reviewed this encounter and updated as appropriate: medications, allergies, medical history  Review of Systems:  No other skin or systemic complaints except as noted in HPI or Assessment and Plan.  Objective  Well appearing patient in no apparent distress; mood and affect are within normal limits.  A full examination was performed including scalp, head, eyes, ears, nose, lips, neck, chest, axillae, abdomen, back, buttocks, bilateral upper extremities, bilateral lower extremities, hands, feet, fingers, toes, fingernails, and toenails. All findings within normal limits unless otherwise noted below.   Relevant physical exam findings are noted in the Assessment and Plan.    Assessment & Plan   NEOPLASM OF UNCERTAIN BEHAVIOUR 3 mm pink papule on right abdomen central vellus hair  Will continue to watch   SKIN CANCER SCREENING PERFORMED TODAY.  ACTINIC DAMAGE - Chronic condition, secondary to cumulative UV/sun exposure - diffuse scaly erythematous macules with underlying dyspigmentation - Recommend daily broad spectrum sunscreen SPF 30+ to sun-exposed areas, reapply every 2 hours as needed.  - Staying in the shade or wearing long sleeves, sun glasses (UVA+UVB protection) and wide brim hats (4-inch brim around the entire circumference of the hat) are also recommended for sun protection.  - Call for new or changing  lesions.  LENTIGINES, SEBORRHEIC KERATOSES, HEMANGIOMAS - Benign normal skin lesions - Benign-appearing - Call for any changes  MELANOCYTIC NEVI - Tan-brown and/or pink-flesh-colored symmetric macules and papules - Benign appearing on exam today - Observation - Call clinic for new or changing moles - Recommend daily use of broad spectrum spf 30+ sunscreen to sun-exposed areas.    INTERTRIGO at abdominal folds / inguinal  Exam Erythematous macerated patches  Chronic and persistent condition with duration or expected duration over one year. Condition is symptomatic/ bothersome to patient. Not currently at goal.   Intertrigo is a chronic recurrent rash that occurs in skin fold areas that may be associated with friction; heat; moisture; yeast; fungus; and bacteria.  It is exacerbated by increased movement / activity; sweating; and higher atmospheric temperature.  Treatment Plan  Start ketoconazole 2% cream twice daily as needed for rash. Start HC 2.5% cream twice daily for up to 2 weeks at a time.  Apply creams before nystatin powder  Topical steroids (such as triamcinolone, fluocinolone, fluocinonide, mometasone, clobetasol, halobetasol, betamethasone, hydrocortisone) can cause thinning and lightening of the skin if they are used for too long in the same area. Your physician has selected the right strength medicine for your problem and area affected on the body. Please use your medication only as directed by your physician to prevent side effects.    Erythema intertrigo  Related Medications ketoconazole (NIZORAL) 2 % cream Apply twice daily as needed for rash.  hydrocortisone 2.5 % cream Apply twice daily for up to 2 weeks as needed.  Multiple benign nevi  Lentigines  Actinic elastosis  Seborrheic keratoses  Cherry angioma  Return in about 1 year (around 07/29/2024) for TBSE.  I, Asher Muir, CMA, am acting as scribe for Elie Goody,  MD.   Documentation: I have reviewed the above documentation for accuracy and completeness, and I agree with the above.  Elie Goody, MD

## 2023-07-30 NOTE — Patient Instructions (Addendum)
Intertrigo is a chronic recurrent rash that occurs in skin fold areas that may be associated with friction; heat; moisture; yeast; fungus; and bacteria.  It is exacerbated by increased movement / activity; sweating; and higher atmospheric temperature.  Continue Nystatin Powder to help absorb moisture  Start ketoconazole 2% cream twice daily as needed for rash. Start HC 2.5% cream twice daily for up to 2 weeks at a time.  Topical steroids (such as triamcinolone, fluocinolone, fluocinonide, mometasone, clobetasol, halobetasol, betamethasone, hydrocortisone) can cause thinning and lightening of the skin if they are used for too long in the same area. Your physician has selected the right strength medicine for your problem and area affected on the body. Please use your medication only as directed by your physician to prevent side effects.      Melanoma ABCDEs  Melanoma is the most dangerous type of skin cancer, and is the leading cause of death from skin disease.  You are more likely to develop melanoma if you: Have light-colored skin, light-colored eyes, or red or blond hair Spend a lot of time in the sun Tan regularly, either outdoors or in a tanning bed Have had blistering sunburns, especially during childhood Have a close family member who has had a melanoma Have atypical moles or large birthmarks  Early detection of melanoma is key since treatment is typically straightforward and cure rates are extremely high if we catch it early.   The first sign of melanoma is often a change in a mole or a new dark spot.  The ABCDE system is a way of remembering the signs of melanoma.  A for asymmetry:  The two halves do not match. B for border:  The edges of the growth are irregular. C for color:  A mixture of colors are present instead of an even brown color. D for diameter:  Melanomas are usually (but not always) greater than 6mm - the size of a pencil eraser. E for evolution:  The spot keeps  changing in size, shape, and color.  Please check your skin once per month between visits. You can use a small mirror in front and a large mirror behind you to keep an eye on the back side or your body.   If you see any new or changing lesions before your next follow-up, please call to schedule a visit.  Please continue daily skin protection including broad spectrum sunscreen SPF 30+ to sun-exposed areas, reapplying every 2 hours as needed when you're outdoors.   Staying in the shade or wearing long sleeves, sun glasses (UVA+UVB protection) and wide brim hats (4-inch brim around the entire circumference of the hat) are also recommended for sun protection.    Due to recent changes in healthcare laws, you may see results of your pathology and/or laboratory studies on MyChart before the doctors have had a chance to review them. We understand that in some cases there may be results that are confusing or concerning to you. Please understand that not all results are received at the same time and often the doctors may need to interpret multiple results in order to provide you with the best plan of care or course of treatment. Therefore, we ask that you please give [place] 2 business days to thoroughly review all your results before contacting the office for clarification. Should we see a critical lab result, you will be contacted sooner.   If You Need Anything After Your Visit  If you have any questions or concerns for your  doctor, please call our main line at [phone] and press option 4 to reach your doctor's medical assistant. If no one answers, please leave a voicemail as directed and we will return your call as soon as possible. Messages left after 4 pm will be answered the following business day.   You may also send [place] a message via MyChart. We typically respond to MyChart messages within 1-2 business days.  For prescription refills, please ask your pharmacy to contact our office. Our fax number is  [phone].  If you have an urgent issue when the clinic is closed that cannot wait until the next business day, you can page your doctor at the number below.    Please note that while we do our best to be available for urgent issues outside of office hours, we are not available 24/7.   If you have an urgent issue and are unable to reach [place], you may choose to seek medical care at your doctor's office, retail clinic, urgent care center, or emergency room.  If you have a medical emergency, please immediately call 911 or go to the emergency department.  Pager Numbers  - Dr. Gwen Pounds: [phone]  - Dr. Roseanne Reno: [phone]  - Dr. Katrinka Blazing: [phone]   In the event of inclement weather, please call our main line at [phone] for an update on the status of any delays or closures.  Dermatology Medication Tips: Please keep the boxes that topical medications come in in order to help keep track of the instructions about where and how to use these. Pharmacies typically print the medication instructions only on the boxes and not directly on the medication tubes.   If your medication is too expensive, please contact our office at [phone] option 4 or send [place] a message through MyChart.   We are unable to tell what your co-pay for medications will be in advance as this is different depending on your insurance coverage. However, we may be able to find a substitute medication at lower cost or fill out paperwork to get insurance to cover a needed medication.   If a prior authorization is required to get your medication covered by your insurance company, please allow [place] 1-2 business days to complete this process.  Drug prices often vary depending on where the prescription is filled and some pharmacies may offer cheaper prices.  The website www.goodrx.com contains coupons for medications through different pharmacies. The prices here do not account for what the cost may be with help from  insurance (it may be cheaper with your insurance), but the website can give you the price if you did not use any insurance.  - You can print the associated coupon and take it with your prescription to the pharmacy.  - You may also stop by our office during regular business hours and pick up a GoodRx coupon card.  - If you need your prescription sent electronically to a different pharmacy, notify our office through [facility] or by phone at [phone] option 4.     Si Usted Necesita Algo Despus de Su Visita  Tambin puede enviarnos un mensaje a travs de Clinical cytogeneticist. Por lo general respondemos a los mensajes de MyChart en el transcurso de 1 a 2 das hbiles.  Para renovar recetas, por favor pida a su farmacia que se ponga en contacto con nuestra oficina. Annie Sable de fax es [place] [phone].  Si tiene un asunto urgente cuando la clnica est cerrada y que no puede esperar Freight forwarder  siguiente da hbil, puede llamar/localizar a su doctor(a) al nmero que aparece a continuacin.   Por favor, tenga en cuenta que aunque hacemos todo lo posible para estar disponibles para asuntos urgentes fuera del horario de [place], no estamos disponibles las 24 horas del da, los 7 [address]  [address] de la [place].   Si tiene un problema urgente y no puede comunicarse con nosotros, puede optar por buscar atencin mdica  en el consultorio de su doctor(a), en una clnica privada, en un centro de atencin urgente o en una sala de emergencias.  Si tiene Engineer, drilling, por favor llame inmediatamente al 911 o vaya a la sala de emergencias.  Nmeros de bper  - Dr. Gwen Pounds: [phone]  - Dra. Roseanne Reno: [phone]  - Dr. Katrinka Blazing: [phone]   En caso de inclemencias del tiempo, por favor llame a Lacy Duverney principal al [phone] para una actualizacin sobre el [place] de cualquier retraso o cierre.  Consejos para la medicacin en dermatologa: Por favor, guarde las cajas en las que vienen los  medicamentos de uso tpico para ayudarle a seguir las instrucciones sobre dnde y cmo usarlos. Las farmacias generalmente imprimen las instrucciones del medicamento slo en las cajas y no directamente en los tubos del [place].   Si su medicamento es muy caro, por favor, pngase en contacto con Rolm Gala llamando al [phone] y presione la opcin 4 o envenos un mensaje a travs de Clinical cytogeneticist.   No podemos decirle cul ser su copago por los medicamentos por adelantado ya que esto es diferente dependiendo de la cobertura de su seguro. Sin embargo, es posible que podamos encontrar un medicamento sustituto a Audiological scientist un formulario para que el seguro cubra el medicamento que se considera necesario.   Si se requiere una autorizacin previa para que su compaa de seguros [place] su medicamento, por favor permtanos de 1 a 2 das hbiles para completar [address]  Los precios de los medicamentos varan con frecuencia dependiendo del Environmental consultant de dnde se surte la receta y alguna farmacias pueden ofrecer precios ms baratos.  El sitio web www.goodrx.com tiene cupones para medicamentos de Health and safety inspector. Los precios aqu no tienen en cuenta lo que podra costar con la ayuda del seguro (puede ser ms barato con su seguro), pero el sitio web puede darle el precio si no utiliz Tourist information centre manager.  - Puede imprimir el cupn correspondiente y llevarlo con su receta a la farmacia.  - Tambin puede pasar por nuestra oficina durante el horario de atencin regular y Education officer, museum una tarjeta de cupones de GoodRx.  - Si necesita que su receta se enve electrnicamente a una farmacia diferente, informe a nuestra oficina a travs de MyChart de [place] o por telfono llamando al [phone] y presione la opcin 4.

## 2023-08-02 ENCOUNTER — Other Ambulatory Visit: Payer: Self-pay

## 2023-08-06 ENCOUNTER — Other Ambulatory Visit: Payer: Self-pay

## 2023-08-08 ENCOUNTER — Other Ambulatory Visit: Payer: Self-pay

## 2023-08-09 ENCOUNTER — Other Ambulatory Visit: Payer: Self-pay

## 2023-08-12 ENCOUNTER — Other Ambulatory Visit: Payer: Self-pay

## 2023-08-13 ENCOUNTER — Other Ambulatory Visit: Payer: Self-pay

## 2023-08-14 ENCOUNTER — Other Ambulatory Visit: Payer: Self-pay

## 2023-09-10 DIAGNOSIS — E1165 Type 2 diabetes mellitus with hyperglycemia: Secondary | ICD-10-CM | POA: Diagnosis not present

## 2023-09-10 DIAGNOSIS — Z0000 Encounter for general adult medical examination without abnormal findings: Secondary | ICD-10-CM | POA: Diagnosis not present

## 2023-09-10 DIAGNOSIS — B372 Candidiasis of skin and nail: Secondary | ICD-10-CM | POA: Diagnosis not present

## 2023-09-10 DIAGNOSIS — K219 Gastro-esophageal reflux disease without esophagitis: Secondary | ICD-10-CM | POA: Diagnosis not present

## 2023-09-10 DIAGNOSIS — I10 Essential (primary) hypertension: Secondary | ICD-10-CM | POA: Diagnosis not present

## 2023-09-14 ENCOUNTER — Other Ambulatory Visit: Payer: Self-pay

## 2023-09-14 DIAGNOSIS — E1165 Type 2 diabetes mellitus with hyperglycemia: Secondary | ICD-10-CM | POA: Diagnosis not present

## 2023-09-14 DIAGNOSIS — K219 Gastro-esophageal reflux disease without esophagitis: Secondary | ICD-10-CM | POA: Diagnosis not present

## 2023-09-14 DIAGNOSIS — K5909 Other constipation: Secondary | ICD-10-CM | POA: Diagnosis not present

## 2023-09-14 DIAGNOSIS — I10 Essential (primary) hypertension: Secondary | ICD-10-CM | POA: Diagnosis not present

## 2023-09-14 DIAGNOSIS — F411 Generalized anxiety disorder: Secondary | ICD-10-CM | POA: Diagnosis not present

## 2023-09-26 ENCOUNTER — Other Ambulatory Visit: Payer: Self-pay

## 2023-09-26 MED ORDER — LOSARTAN POTASSIUM 25 MG PO TABS
25.0000 mg | ORAL_TABLET | Freq: Every day | ORAL | 1 refills | Status: AC
  Filled 2023-09-26: qty 90, 90d supply, fill #0

## 2023-09-26 MED ORDER — PHENDIMETRAZINE TARTRATE 35 MG PO TABS
35.0000 mg | ORAL_TABLET | Freq: Three times a day (TID) | ORAL | 3 refills | Status: AC
  Filled 2023-09-26: qty 90, 30d supply, fill #0

## 2023-09-26 MED ORDER — JARDIANCE 10 MG PO TABS
10.0000 mg | ORAL_TABLET | Freq: Every day | ORAL | 1 refills | Status: AC
  Filled 2023-09-26: qty 90, 90d supply, fill #0

## 2023-09-26 MED ORDER — GLIPIZIDE 10 MG PO TABS
10.0000 mg | ORAL_TABLET | Freq: Two times a day (BID) | ORAL | 1 refills | Status: AC
  Filled 2023-09-26: qty 180, 90d supply, fill #0

## 2023-09-26 MED ORDER — ESOMEPRAZOLE MAGNESIUM 40 MG PO CPDR
40.0000 mg | DELAYED_RELEASE_CAPSULE | Freq: Two times a day (BID) | ORAL | 1 refills | Status: AC
  Filled 2023-09-26: qty 180, 90d supply, fill #0

## 2023-09-26 MED ORDER — METFORMIN HCL 1000 MG PO TABS
1000.0000 mg | ORAL_TABLET | Freq: Two times a day (BID) | ORAL | 1 refills | Status: AC
  Filled 2023-09-26: qty 180, 90d supply, fill #0

## 2023-09-26 MED ORDER — ATORVASTATIN CALCIUM 10 MG PO TABS
10.0000 mg | ORAL_TABLET | Freq: Every day | ORAL | 1 refills | Status: AC
  Filled 2023-09-26: qty 90, 90d supply, fill #0

## 2023-09-27 ENCOUNTER — Other Ambulatory Visit: Payer: Self-pay

## 2023-09-28 ENCOUNTER — Other Ambulatory Visit: Payer: Self-pay

## 2023-10-07 ENCOUNTER — Other Ambulatory Visit: Payer: Self-pay

## 2023-10-08 ENCOUNTER — Other Ambulatory Visit: Payer: Self-pay

## 2023-10-09 ENCOUNTER — Other Ambulatory Visit: Payer: Self-pay

## 2023-11-04 ENCOUNTER — Other Ambulatory Visit: Payer: Self-pay

## 2023-11-05 ENCOUNTER — Other Ambulatory Visit: Payer: Self-pay

## 2023-11-05 MED ORDER — PHENDIMETRAZINE TARTRATE 35 MG PO TABS
35.0000 mg | ORAL_TABLET | Freq: Three times a day (TID) | ORAL | 3 refills | Status: AC
  Filled 2023-11-05: qty 90, 30d supply, fill #0

## 2023-11-05 MED ORDER — NYSTATIN 100000 UNIT/GM EX POWD
Freq: Two times a day (BID) | CUTANEOUS | 1 refills | Status: AC
  Filled 2023-11-05: qty 60, 30d supply, fill #0

## 2023-11-05 MED ORDER — OZEMPIC (2 MG/DOSE) 8 MG/3ML ~~LOC~~ SOPN
2.0000 mg | PEN_INJECTOR | SUBCUTANEOUS | 5 refills | Status: AC
  Filled 2023-11-05: qty 3, 28d supply, fill #0
  Filled 2024-06-24: qty 9, 84d supply, fill #1
  Filled 2024-09-24: qty 3, 28d supply, fill #2

## 2023-11-13 ENCOUNTER — Other Ambulatory Visit: Payer: Self-pay

## 2023-11-13 DIAGNOSIS — J029 Acute pharyngitis, unspecified: Secondary | ICD-10-CM | POA: Diagnosis not present

## 2023-11-13 MED ORDER — AMOXICILLIN 875 MG PO TABS
875.0000 mg | ORAL_TABLET | Freq: Two times a day (BID) | ORAL | 0 refills | Status: AC
  Filled 2023-11-13: qty 14, 7d supply, fill #0

## 2023-11-19 ENCOUNTER — Other Ambulatory Visit: Payer: Self-pay

## 2023-11-22 ENCOUNTER — Other Ambulatory Visit: Payer: Self-pay

## 2023-11-22 MED ORDER — TRAMADOL HCL 50 MG PO TABS
50.0000 mg | ORAL_TABLET | Freq: Four times a day (QID) | ORAL | 0 refills | Status: AC | PRN
  Filled 2023-11-22: qty 12, 3d supply, fill #0

## 2023-11-22 MED ORDER — IBUPROFEN 800 MG PO TABS
800.0000 mg | ORAL_TABLET | Freq: Four times a day (QID) | ORAL | 0 refills | Status: AC | PRN
  Filled 2023-11-22: qty 21, 6d supply, fill #0

## 2023-11-22 MED ORDER — AMOXICILLIN 500 MG PO CAPS
500.0000 mg | ORAL_CAPSULE | Freq: Three times a day (TID) | ORAL | 0 refills | Status: AC
  Filled 2023-11-22: qty 21, 7d supply, fill #0

## 2023-11-23 ENCOUNTER — Other Ambulatory Visit: Payer: Self-pay

## 2023-12-12 ENCOUNTER — Other Ambulatory Visit: Payer: Self-pay

## 2023-12-12 MED ORDER — FREESTYLE LITE TEST VI STRP
ORAL_STRIP | 1 refills | Status: AC
  Filled 2023-12-12: qty 200, 90d supply, fill #0

## 2023-12-12 MED ORDER — JARDIANCE 10 MG PO TABS
10.0000 mg | ORAL_TABLET | Freq: Every day | ORAL | 1 refills | Status: AC
  Filled 2023-12-12: qty 90, 90d supply, fill #0

## 2023-12-12 MED ORDER — LOSARTAN POTASSIUM 25 MG PO TABS
25.0000 mg | ORAL_TABLET | Freq: Every day | ORAL | 1 refills | Status: AC
  Filled 2023-12-12: qty 90, 90d supply, fill #0

## 2023-12-12 MED ORDER — ATORVASTATIN CALCIUM 10 MG PO TABS
10.0000 mg | ORAL_TABLET | Freq: Every day | ORAL | 1 refills | Status: AC
  Filled 2023-12-12: qty 90, 90d supply, fill #0

## 2023-12-12 MED ORDER — OZEMPIC (2 MG/DOSE) 8 MG/3ML ~~LOC~~ SOPN
2.0000 mg | PEN_INJECTOR | SUBCUTANEOUS | 5 refills | Status: AC
  Filled 2023-12-12: qty 3, 28d supply, fill #0

## 2023-12-12 MED ORDER — METFORMIN HCL 1000 MG PO TABS
1000.0000 mg | ORAL_TABLET | Freq: Two times a day (BID) | ORAL | 1 refills | Status: AC
  Filled 2023-12-12: qty 180, 90d supply, fill #0

## 2023-12-12 MED ORDER — PHENDIMETRAZINE TARTRATE 35 MG PO TABS
35.0000 mg | ORAL_TABLET | Freq: Three times a day (TID) | ORAL | 3 refills | Status: AC
  Filled 2023-12-12: qty 90, 30d supply, fill #0
  Filled 2024-01-15: qty 90, 30d supply, fill #1
  Filled 2024-02-18: qty 90, 30d supply, fill #2

## 2023-12-12 MED ORDER — ALPRAZOLAM 0.25 MG PO TABS
0.2500 mg | ORAL_TABLET | Freq: Every day | ORAL | 2 refills | Status: AC
  Filled 2023-12-12: qty 30, 30d supply, fill #0

## 2023-12-12 MED ORDER — GLIPIZIDE 10 MG PO TABS
10.0000 mg | ORAL_TABLET | Freq: Two times a day (BID) | ORAL | 1 refills | Status: AC
  Filled 2023-12-12: qty 180, 90d supply, fill #0

## 2023-12-12 MED ORDER — ESOMEPRAZOLE MAGNESIUM 40 MG PO CPDR
40.0000 mg | DELAYED_RELEASE_CAPSULE | Freq: Two times a day (BID) | ORAL | 1 refills | Status: AC
  Filled 2023-12-12: qty 180, 90d supply, fill #0

## 2023-12-18 ENCOUNTER — Other Ambulatory Visit: Payer: Self-pay

## 2023-12-18 DIAGNOSIS — E1165 Type 2 diabetes mellitus with hyperglycemia: Secondary | ICD-10-CM | POA: Diagnosis not present

## 2023-12-18 DIAGNOSIS — I10 Essential (primary) hypertension: Secondary | ICD-10-CM | POA: Diagnosis not present

## 2023-12-18 DIAGNOSIS — R21 Rash and other nonspecific skin eruption: Secondary | ICD-10-CM | POA: Diagnosis not present

## 2023-12-18 MED ORDER — CLOBETASOL PROPIONATE 0.05 % EX OINT
TOPICAL_OINTMENT | Freq: Two times a day (BID) | CUTANEOUS | 0 refills | Status: AC
  Filled 2023-12-18: qty 30, 30d supply, fill #0

## 2023-12-24 ENCOUNTER — Ambulatory Visit: Payer: 59 | Admitting: Dermatology

## 2023-12-24 ENCOUNTER — Ambulatory Visit (INDEPENDENT_AMBULATORY_CARE_PROVIDER_SITE_OTHER): Payer: 59 | Admitting: Dermatology

## 2023-12-24 ENCOUNTER — Encounter: Payer: Self-pay | Admitting: Dermatology

## 2023-12-24 DIAGNOSIS — L404 Guttate psoriasis: Secondary | ICD-10-CM | POA: Diagnosis not present

## 2023-12-24 DIAGNOSIS — Z7189 Other specified counseling: Secondary | ICD-10-CM

## 2023-12-24 NOTE — Progress Notes (Signed)
   Follow Up Visit   Subjective  Tonya Sampson is a 48 y.o. female who presents for the following: Rash that started about 11/26/23. No itch, no burn, no pain. Started at torso and continued to get new spots.  All over except for face and hands, worse at abdomen. Patient went to see NP and was given clobetasol ointment which has helped. Patient advises rash has improved.   Patient had strep throat at the beginning of January and was put on amoxicillin.   The following portions of the chart were reviewed this encounter and updated as appropriate: medications, allergies, medical history  Review of Systems:  No other skin or systemic complaints except as noted in HPI or Assessment and Plan.  Objective  Well appearing patient in no apparent distress; mood and affect are within normal limits.  A focused examination was performed of the following areas: Back, abdomen, legs  Relevant exam findings are noted in the Assessment and Plan.    Assessment & Plan   Photos provided by patient of rash taken 12/14/23.         GUTTATE PSORIASIS   Related Procedures PR THERAPEUTIC LIGHTBOX TABLETP COUNSELING AND COORDINATION OF CARE    RASH - Guttate Psoriasis, potential to become chronic, flaring, not at patient goal Exam: innumerable small circular pink scaly papules on trunk > extremities. Cannot see nails due to polish  Had strep throat early in January  Treatment Plan: Has potential to become chronic and require systemic treatment Continue clobetasol ointment twice daily to aa. Avoid applying to face, groin, and axilla. Use as directed. Long-term use can cause thinning of the skin. Discussed treating with NVUVB vs Biologics. Patient would like to treat with NVUVB. Patient treated in light box today for 1 minute 28 seconds.   Topical steroids (such as triamcinolone, fluocinolone, fluocinonide, mometasone, clobetasol, halobetasol, betamethasone, hydrocortisone) can cause thinning  and lightening of the skin if they are used for too long in the same area. Your physician has selected the right strength medicine for your problem and area affected on the body. Please use your medication only as directed by your physician to prevent side effects.     Return for NVUVB in 2 days, 2-3 months with Dr. Katrinka Blazing.  Anise Salvo, RMA, am acting as scribe for Tonya Goody, MD .   Documentation: I have reviewed the above documentation for accuracy and completeness, and I agree with the above.  Tonya Goody, MD

## 2023-12-24 NOTE — Patient Instructions (Signed)

## 2023-12-26 ENCOUNTER — Ambulatory Visit: Payer: 59

## 2023-12-28 ENCOUNTER — Other Ambulatory Visit: Payer: Self-pay

## 2023-12-28 MED ORDER — ATORVASTATIN CALCIUM 10 MG PO TABS
10.0000 mg | ORAL_TABLET | Freq: Every day | ORAL | 1 refills | Status: AC
  Filled 2023-12-28: qty 90, 90d supply, fill #0

## 2023-12-28 MED ORDER — OZEMPIC (2 MG/DOSE) 8 MG/3ML ~~LOC~~ SOPN
2.0000 mg | PEN_INJECTOR | SUBCUTANEOUS | 5 refills | Status: AC
Start: 2023-12-28 — End: ?
  Filled 2023-12-28: qty 3, 28d supply, fill #0

## 2023-12-28 MED ORDER — JARDIANCE 10 MG PO TABS
10.0000 mg | ORAL_TABLET | Freq: Every day | ORAL | 1 refills | Status: AC
  Filled 2023-12-28: qty 90, 90d supply, fill #0

## 2023-12-28 MED ORDER — GLIPIZIDE 10 MG PO TABS
10.0000 mg | ORAL_TABLET | Freq: Two times a day (BID) | ORAL | 1 refills | Status: AC
  Filled 2023-12-28: qty 180, 90d supply, fill #0

## 2023-12-28 MED ORDER — ESOMEPRAZOLE MAGNESIUM 40 MG PO CPDR
40.0000 mg | DELAYED_RELEASE_CAPSULE | Freq: Two times a day (BID) | ORAL | 1 refills | Status: AC
  Filled 2023-12-28: qty 180, 90d supply, fill #0

## 2023-12-28 MED ORDER — LOSARTAN POTASSIUM 25 MG PO TABS
25.0000 mg | ORAL_TABLET | Freq: Every day | ORAL | 1 refills | Status: DC
  Filled 2023-12-28: qty 90, 90d supply, fill #0

## 2023-12-28 MED ORDER — METFORMIN HCL 1000 MG PO TABS
1000.0000 mg | ORAL_TABLET | Freq: Two times a day (BID) | ORAL | 1 refills | Status: AC
  Filled 2023-12-28: qty 180, 90d supply, fill #0

## 2023-12-31 ENCOUNTER — Telehealth: Payer: Self-pay

## 2023-12-31 ENCOUNTER — Ambulatory Visit (INDEPENDENT_AMBULATORY_CARE_PROVIDER_SITE_OTHER): Payer: 59

## 2023-12-31 DIAGNOSIS — L404 Guttate psoriasis: Secondary | ICD-10-CM

## 2023-12-31 NOTE — Progress Notes (Unsigned)
 Patient here today to for Phototherapy for guttate psoriasis.  Patient treated in the lightbox/NBUVB for 1 minutes and 38 seconds on each front and back side.   Dorathy Daft, RMA

## 2023-12-31 NOTE — Telephone Encounter (Signed)
 Okay to enter orders for lightbox treatments?

## 2024-01-02 ENCOUNTER — Ambulatory Visit (INDEPENDENT_AMBULATORY_CARE_PROVIDER_SITE_OTHER): Payer: 59

## 2024-01-02 DIAGNOSIS — L404 Guttate psoriasis: Secondary | ICD-10-CM | POA: Diagnosis not present

## 2024-01-02 NOTE — Progress Notes (Signed)
 Patient here today to for Phototherapy for guttate psoriasis.  Patient treated in the lightbox/NBUVB for 1 minutes and 48 seconds on each front and back side.    Dorathy Daft, RMA

## 2024-01-04 ENCOUNTER — Other Ambulatory Visit: Payer: Self-pay

## 2024-01-07 ENCOUNTER — Ambulatory Visit (INDEPENDENT_AMBULATORY_CARE_PROVIDER_SITE_OTHER): Payer: 59

## 2024-01-07 DIAGNOSIS — L404 Guttate psoriasis: Secondary | ICD-10-CM

## 2024-01-07 NOTE — Progress Notes (Signed)
 Patient here today to for Phototherapy for guttate psoriasis.  Patient treated in the lightbox/NBUVB for 1 minutes and 58 seconds on each front and back side.    Dorathy Daft, RMA

## 2024-01-08 ENCOUNTER — Telehealth: Payer: Self-pay

## 2024-01-08 NOTE — Telephone Encounter (Signed)
 Patient was here for lightbox treatment and was questioning how long can it typically take for someone to see improvement with her diagnosis. She also wanted to know about the hypopigmentation and if this could possibly go away in the future?  I can take photos at her appt tomorrow.

## 2024-01-09 ENCOUNTER — Ambulatory Visit: Payer: 59

## 2024-01-09 DIAGNOSIS — K219 Gastro-esophageal reflux disease without esophagitis: Secondary | ICD-10-CM | POA: Diagnosis not present

## 2024-01-09 DIAGNOSIS — K5909 Other constipation: Secondary | ICD-10-CM | POA: Diagnosis not present

## 2024-01-09 DIAGNOSIS — E1165 Type 2 diabetes mellitus with hyperglycemia: Secondary | ICD-10-CM | POA: Diagnosis not present

## 2024-01-09 DIAGNOSIS — I10 Essential (primary) hypertension: Secondary | ICD-10-CM | POA: Diagnosis not present

## 2024-01-09 DIAGNOSIS — E786 Lipoprotein deficiency: Secondary | ICD-10-CM | POA: Diagnosis not present

## 2024-01-09 DIAGNOSIS — F411 Generalized anxiety disorder: Secondary | ICD-10-CM | POA: Diagnosis not present

## 2024-01-14 DIAGNOSIS — E1165 Type 2 diabetes mellitus with hyperglycemia: Secondary | ICD-10-CM | POA: Diagnosis not present

## 2024-01-14 DIAGNOSIS — I10 Essential (primary) hypertension: Secondary | ICD-10-CM | POA: Diagnosis not present

## 2024-01-14 DIAGNOSIS — R42 Dizziness and giddiness: Secondary | ICD-10-CM | POA: Diagnosis not present

## 2024-01-14 DIAGNOSIS — K219 Gastro-esophageal reflux disease without esophagitis: Secondary | ICD-10-CM | POA: Diagnosis not present

## 2024-01-15 ENCOUNTER — Other Ambulatory Visit: Payer: Self-pay

## 2024-02-05 ENCOUNTER — Other Ambulatory Visit: Payer: Self-pay

## 2024-02-05 MED ORDER — OZEMPIC (2 MG/DOSE) 8 MG/3ML ~~LOC~~ SOPN
2.0000 mg | PEN_INJECTOR | SUBCUTANEOUS | 5 refills | Status: AC
  Filled 2024-02-05: qty 3, 28d supply, fill #0

## 2024-02-05 MED ORDER — NYSTATIN 100000 UNIT/GM EX POWD
Freq: Two times a day (BID) | CUTANEOUS | 1 refills | Status: AC
  Filled 2024-02-05: qty 60, 30d supply, fill #0

## 2024-02-18 ENCOUNTER — Other Ambulatory Visit: Payer: Self-pay

## 2024-02-21 ENCOUNTER — Ambulatory Visit: Payer: 59 | Admitting: Dermatology

## 2024-03-10 ENCOUNTER — Other Ambulatory Visit: Payer: Self-pay

## 2024-03-10 MED ORDER — ALPRAZOLAM 0.25 MG PO TABS
0.2500 mg | ORAL_TABLET | Freq: Every evening | ORAL | 2 refills | Status: AC | PRN
  Filled 2024-03-10: qty 30, 30d supply, fill #0

## 2024-03-10 MED ORDER — OZEMPIC (2 MG/DOSE) 8 MG/3ML ~~LOC~~ SOPN
2.0000 mg | PEN_INJECTOR | SUBCUTANEOUS | 5 refills | Status: AC
Start: 2024-03-10 — End: ?
  Filled 2024-03-10: qty 3, 28d supply, fill #0

## 2024-03-11 ENCOUNTER — Encounter: Payer: Self-pay | Admitting: Dermatology

## 2024-03-11 ENCOUNTER — Ambulatory Visit (INDEPENDENT_AMBULATORY_CARE_PROVIDER_SITE_OTHER): Admitting: Dermatology

## 2024-03-11 DIAGNOSIS — L818 Other specified disorders of pigmentation: Secondary | ICD-10-CM | POA: Diagnosis not present

## 2024-03-11 DIAGNOSIS — L404 Guttate psoriasis: Secondary | ICD-10-CM | POA: Diagnosis not present

## 2024-03-11 DIAGNOSIS — Z7189 Other specified counseling: Secondary | ICD-10-CM | POA: Diagnosis not present

## 2024-03-11 NOTE — Progress Notes (Signed)
   Follow-Up Visit   Subjective  Tonya Sampson is a 48 y.o. female who presents for the following: Psoriasis - improved with Clobetasol /Eucerin mix QD PRN flares, hasn't had to use it this week, no longer doing NBUVB.    The following portions of the chart were reviewed this encounter and updated as appropriate: medications, allergies, medical history  Review of Systems:  No other skin or systemic complaints except as noted in HPI or Assessment and Plan.  Objective  Well appearing patient in no apparent distress; mood and affect are within normal limits.  Areas Examined: The face, arms, and abdomen  Relevant exam findings are noted in the Assessment and Plan.      Assessment & Plan   GUTTATE PSORIASIS    GUTTATE PSORIASIS s/p phototherapy clobetasol  Exam: postinflammatory hypopigmentation on abdomen. Clear of active lesions on trunk extremities 0% BSA.  Chronic condition with duration or expected duration over one year. Currently well-controlled.   Treatment Plan: May not become chronic psoriasis Discussed with patient that she may flare again should she get strep throat.   Counseling on psoriasis and coordination of care  psoriasis is a chronic non-curable, but treatable genetic/hereditary disease that may have other systemic features affecting other organ systems such as joints (Psoriatic Arthritis). It is associated with an increased risk of inflammatory bowel disease, heart disease, non-alcoholic fatty liver disease, and depression.  Treatments include light and laser treatments; topical medications; and systemic medications including oral and injectables.  Return for appointment as scheduled.  Arlinda Lais, CMA, am acting as scribe for Harris Liming, MD .   Documentation: I have reviewed the above documentation for accuracy and completeness, and I agree with the above.  Harris Liming, MD

## 2024-03-11 NOTE — Patient Instructions (Signed)

## 2024-03-25 ENCOUNTER — Other Ambulatory Visit: Payer: Self-pay

## 2024-03-25 DIAGNOSIS — E7800 Pure hypercholesterolemia, unspecified: Secondary | ICD-10-CM | POA: Diagnosis not present

## 2024-03-25 DIAGNOSIS — M898X1 Other specified disorders of bone, shoulder: Secondary | ICD-10-CM | POA: Diagnosis not present

## 2024-03-25 DIAGNOSIS — E1165 Type 2 diabetes mellitus with hyperglycemia: Secondary | ICD-10-CM | POA: Diagnosis not present

## 2024-03-25 DIAGNOSIS — I10 Essential (primary) hypertension: Secondary | ICD-10-CM | POA: Diagnosis not present

## 2024-03-25 MED ORDER — TIZANIDINE HCL 2 MG PO TABS
2.0000 mg | ORAL_TABLET | Freq: Three times a day (TID) | ORAL | 1 refills | Status: AC | PRN
  Filled 2024-03-25: qty 90, 30d supply, fill #0

## 2024-03-25 MED ORDER — GABAPENTIN 100 MG PO CAPS
100.0000 mg | ORAL_CAPSULE | Freq: Two times a day (BID) | ORAL | 2 refills | Status: AC
  Filled 2024-03-25: qty 60, 30d supply, fill #0

## 2024-04-10 ENCOUNTER — Other Ambulatory Visit: Payer: Self-pay

## 2024-04-10 ENCOUNTER — Other Ambulatory Visit (HOSPITAL_COMMUNITY): Payer: Self-pay

## 2024-04-10 MED ORDER — JARDIANCE 10 MG PO TABS
10.0000 mg | ORAL_TABLET | Freq: Every day | ORAL | 1 refills | Status: AC
  Filled 2024-04-10: qty 90, 90d supply, fill #0

## 2024-04-10 MED ORDER — ESOMEPRAZOLE MAGNESIUM 40 MG PO CPDR
40.0000 mg | DELAYED_RELEASE_CAPSULE | Freq: Two times a day (BID) | ORAL | 1 refills | Status: AC
  Filled 2024-04-10: qty 180, 90d supply, fill #0

## 2024-04-10 MED ORDER — PHENDIMETRAZINE TARTRATE 35 MG PO TABS
1.0000 | ORAL_TABLET | Freq: Three times a day (TID) | ORAL | 3 refills | Status: AC
  Filled 2024-04-10: qty 90, 30d supply, fill #0
  Filled 2024-05-15: qty 90, 30d supply, fill #1

## 2024-04-10 MED ORDER — OZEMPIC (2 MG/DOSE) 8 MG/3ML ~~LOC~~ SOPN
2.0000 mg | PEN_INJECTOR | SUBCUTANEOUS | 5 refills | Status: AC
  Filled 2024-04-10: qty 9, 84d supply, fill #0

## 2024-04-11 ENCOUNTER — Other Ambulatory Visit: Payer: Self-pay

## 2024-04-14 ENCOUNTER — Other Ambulatory Visit: Payer: Self-pay

## 2024-04-22 ENCOUNTER — Other Ambulatory Visit: Payer: Self-pay

## 2024-04-22 MED ORDER — ALPRAZOLAM 0.25 MG PO TABS
0.2500 mg | ORAL_TABLET | Freq: Every evening | ORAL | 2 refills | Status: AC | PRN
  Filled 2024-04-22: qty 30, 30d supply, fill #0

## 2024-04-22 MED ORDER — ONDANSETRON HCL 4 MG PO TABS
4.0000 mg | ORAL_TABLET | Freq: Two times a day (BID) | ORAL | 1 refills | Status: AC | PRN
  Filled 2024-04-22: qty 30, 15d supply, fill #0

## 2024-05-01 DIAGNOSIS — E113293 Type 2 diabetes mellitus with mild nonproliferative diabetic retinopathy without macular edema, bilateral: Secondary | ICD-10-CM | POA: Diagnosis not present

## 2024-05-01 DIAGNOSIS — H04123 Dry eye syndrome of bilateral lacrimal glands: Secondary | ICD-10-CM | POA: Diagnosis not present

## 2024-05-01 DIAGNOSIS — H40053 Ocular hypertension, bilateral: Secondary | ICD-10-CM | POA: Diagnosis not present

## 2024-05-14 DIAGNOSIS — I10 Essential (primary) hypertension: Secondary | ICD-10-CM | POA: Diagnosis not present

## 2024-05-14 DIAGNOSIS — E786 Lipoprotein deficiency: Secondary | ICD-10-CM | POA: Diagnosis not present

## 2024-05-14 DIAGNOSIS — R42 Dizziness and giddiness: Secondary | ICD-10-CM | POA: Diagnosis not present

## 2024-05-14 DIAGNOSIS — K219 Gastro-esophageal reflux disease without esophagitis: Secondary | ICD-10-CM | POA: Diagnosis not present

## 2024-05-14 DIAGNOSIS — E1165 Type 2 diabetes mellitus with hyperglycemia: Secondary | ICD-10-CM | POA: Diagnosis not present

## 2024-05-15 ENCOUNTER — Other Ambulatory Visit: Payer: Self-pay

## 2024-06-16 ENCOUNTER — Other Ambulatory Visit: Payer: Self-pay

## 2024-06-16 MED ORDER — ESOMEPRAZOLE MAGNESIUM 40 MG PO CPDR
40.0000 mg | DELAYED_RELEASE_CAPSULE | Freq: Two times a day (BID) | ORAL | 1 refills | Status: AC
  Filled 2024-06-16: qty 180, 90d supply, fill #0

## 2024-06-16 MED ORDER — FREESTYLE LANCETS MISC
1 refills | Status: AC
  Filled 2024-06-16: qty 100, 90d supply, fill #0

## 2024-06-16 MED ORDER — METFORMIN HCL 1000 MG PO TABS
1000.0000 mg | ORAL_TABLET | Freq: Two times a day (BID) | ORAL | 1 refills | Status: AC
  Filled 2024-06-16: qty 180, 90d supply, fill #0

## 2024-06-16 MED ORDER — JARDIANCE 10 MG PO TABS
10.0000 mg | ORAL_TABLET | Freq: Every day | ORAL | 1 refills | Status: AC
  Filled 2024-06-16: qty 90, 90d supply, fill #0

## 2024-06-16 MED ORDER — OZEMPIC (2 MG/DOSE) 8 MG/3ML ~~LOC~~ SOPN
2.0000 mg | PEN_INJECTOR | SUBCUTANEOUS | 5 refills | Status: AC
  Filled 2024-06-16: qty 9, 84d supply, fill #0
  Filled 2024-11-10: qty 3, 28d supply, fill #0

## 2024-06-16 MED ORDER — FREESTYLE LITE TEST VI STRP
ORAL_STRIP | 1 refills | Status: AC
  Filled 2024-06-16: qty 200, 90d supply, fill #0

## 2024-06-16 MED ORDER — PHENDIMETRAZINE TARTRATE 35 MG PO TABS
35.0000 mg | ORAL_TABLET | Freq: Three times a day (TID) | ORAL | 3 refills | Status: AC
  Filled 2024-06-16: qty 90, 30d supply, fill #0

## 2024-06-16 MED ORDER — ALPRAZOLAM 0.25 MG PO TABS
0.2500 mg | ORAL_TABLET | Freq: Every evening | ORAL | 2 refills | Status: AC | PRN
  Filled 2024-06-16: qty 30, 30d supply, fill #0

## 2024-06-16 MED ORDER — GLIPIZIDE 10 MG PO TABS
10.0000 mg | ORAL_TABLET | Freq: Two times a day (BID) | ORAL | 1 refills | Status: AC
  Filled 2024-06-16: qty 180, 90d supply, fill #0

## 2024-06-24 ENCOUNTER — Other Ambulatory Visit: Payer: Self-pay

## 2024-06-24 DIAGNOSIS — E7800 Pure hypercholesterolemia, unspecified: Secondary | ICD-10-CM | POA: Diagnosis not present

## 2024-06-24 DIAGNOSIS — J209 Acute bronchitis, unspecified: Secondary | ICD-10-CM | POA: Diagnosis not present

## 2024-06-24 DIAGNOSIS — Z1211 Encounter for screening for malignant neoplasm of colon: Secondary | ICD-10-CM | POA: Diagnosis not present

## 2024-06-24 DIAGNOSIS — E1165 Type 2 diabetes mellitus with hyperglycemia: Secondary | ICD-10-CM | POA: Diagnosis not present

## 2024-06-24 DIAGNOSIS — K219 Gastro-esophageal reflux disease without esophagitis: Secondary | ICD-10-CM | POA: Diagnosis not present

## 2024-06-24 MED ORDER — CEFUROXIME AXETIL 250 MG PO TABS
250.0000 mg | ORAL_TABLET | Freq: Two times a day (BID) | ORAL | 0 refills | Status: AC
  Filled 2024-06-24: qty 20, 10d supply, fill #0

## 2024-06-24 MED ORDER — AZELASTINE-FLUTICASONE 137-50 MCG/ACT NA SUSP
1.0000 | Freq: Two times a day (BID) | NASAL | 1 refills | Status: AC
  Filled 2024-06-24: qty 23, 30d supply, fill #0

## 2024-06-25 ENCOUNTER — Other Ambulatory Visit: Payer: Self-pay

## 2024-06-25 MED ORDER — FLUTICASONE PROPIONATE 50 MCG/ACT NA SUSP
2.0000 | Freq: Every day | NASAL | 2 refills | Status: AC
  Filled 2024-06-25: qty 16, 30d supply, fill #0

## 2024-06-25 MED ORDER — AZELASTINE HCL 0.1 % NA SOLN
NASAL | 2 refills | Status: AC
  Filled 2024-06-25: qty 30, 30d supply, fill #0

## 2024-06-30 ENCOUNTER — Other Ambulatory Visit: Payer: Self-pay

## 2024-06-30 MED ORDER — ESOMEPRAZOLE MAGNESIUM 40 MG PO CPDR
DELAYED_RELEASE_CAPSULE | ORAL | 1 refills | Status: AC
  Filled 2024-06-30: qty 90, 45d supply, fill #0

## 2024-06-30 MED ORDER — JARDIANCE 10 MG PO TABS
10.0000 mg | ORAL_TABLET | Freq: Every day | ORAL | 1 refills | Status: AC
  Filled 2024-06-30: qty 90, 90d supply, fill #0

## 2024-07-10 ENCOUNTER — Other Ambulatory Visit: Payer: Self-pay

## 2024-07-11 DIAGNOSIS — Z1211 Encounter for screening for malignant neoplasm of colon: Secondary | ICD-10-CM | POA: Diagnosis not present

## 2024-07-17 LAB — COLOGUARD: COLOGUARD: NEGATIVE

## 2024-07-29 ENCOUNTER — Ambulatory Visit: Payer: 59 | Admitting: Dermatology

## 2024-07-29 ENCOUNTER — Encounter: Payer: Self-pay | Admitting: Dermatology

## 2024-07-29 DIAGNOSIS — L814 Other melanin hyperpigmentation: Secondary | ICD-10-CM | POA: Diagnosis not present

## 2024-07-29 DIAGNOSIS — W57XXXA Bitten or stung by nonvenomous insect and other nonvenomous arthropods, initial encounter: Secondary | ICD-10-CM

## 2024-07-29 DIAGNOSIS — Z1283 Encounter for screening for malignant neoplasm of skin: Secondary | ICD-10-CM

## 2024-07-29 DIAGNOSIS — L821 Other seborrheic keratosis: Secondary | ICD-10-CM

## 2024-07-29 DIAGNOSIS — L578 Other skin changes due to chronic exposure to nonionizing radiation: Secondary | ICD-10-CM

## 2024-07-29 DIAGNOSIS — D2261 Melanocytic nevi of right upper limb, including shoulder: Secondary | ICD-10-CM

## 2024-07-29 DIAGNOSIS — D225 Melanocytic nevi of trunk: Secondary | ICD-10-CM | POA: Diagnosis not present

## 2024-07-29 DIAGNOSIS — D239 Other benign neoplasm of skin, unspecified: Secondary | ICD-10-CM

## 2024-07-29 DIAGNOSIS — D1801 Hemangioma of skin and subcutaneous tissue: Secondary | ICD-10-CM | POA: Diagnosis not present

## 2024-07-29 DIAGNOSIS — D229 Melanocytic nevi, unspecified: Secondary | ICD-10-CM

## 2024-07-29 DIAGNOSIS — L810 Postinflammatory hyperpigmentation: Secondary | ICD-10-CM

## 2024-07-29 DIAGNOSIS — D492 Neoplasm of unspecified behavior of bone, soft tissue, and skin: Secondary | ICD-10-CM | POA: Diagnosis not present

## 2024-07-29 DIAGNOSIS — W908XXA Exposure to other nonionizing radiation, initial encounter: Secondary | ICD-10-CM | POA: Diagnosis not present

## 2024-07-29 HISTORY — DX: Other benign neoplasm of skin, unspecified: D23.9

## 2024-07-29 NOTE — Progress Notes (Signed)
 Follow-Up Visit   Subjective  Tonya Sampson is a 48 y.o. female who presents for the following: Skin Cancer Screening and Full Body Skin Exam. No personal Hx of skin cancer or dysplastic nevi. Mother, hx of skin cancer, Mohs.   Insect bite on R ankle. Would like checked out. Irritating.   The patient presents for Total-Body Skin Exam (TBSE) for skin cancer screening and mole check. The patient has spots, moles and lesions to be evaluated, some may be new or changing and the patient may have concern these could be cancer.    The following portions of the chart were reviewed this encounter and updated as appropriate: medications, allergies, medical history  Review of Systems:  No other skin or systemic complaints except as noted in HPI or Assessment and Plan.  Objective  Well appearing patient in no apparent distress; mood and affect are within normal limits.  A full examination was performed including scalp, head, eyes, ears, nose, lips, neck, chest, axillae, abdomen, back, buttocks, bilateral upper extremities, bilateral lower extremities, hands, feet, fingers, toes, fingernails, and toenails. All findings within normal limits unless otherwise noted below.   Relevant physical exam findings are noted in the Assessment and Plan.  Exam of nails limited by presence of nail polish.  Right posterior shoulder 4 mm pigmented macule  lower spinal back 7 mm pigmented irregular macule  Right Triangular Fossa Brown macule   Assessment & Plan   SKIN CANCER SCREENING PERFORMED TODAY.  ACTINIC DAMAGE - Chronic condition, secondary to cumulative UV/sun exposure - diffuse scaly erythematous macules with underlying dyspigmentation - Recommend daily broad spectrum sunscreen SPF 30+ to sun-exposed areas, reapply every 2 hours as needed.  - Staying in the shade or wearing long sleeves, sun glasses (UVA+UVB protection) and wide brim hats (4-inch brim around the entire circumference of the  hat) are also recommended for sun protection.  - Call for new or changing lesions.  LENTIGINES, SEBORRHEIC KERATOSES, HEMANGIOMAS - Benign normal skin lesions - Benign-appearing - Call for any changes  MELANOCYTIC NEVI - Tan-brown and/or pink-flesh-colored symmetric macules and papules - Benign appearing on exam today - Observation - Call clinic for new or changing moles - Recommend daily use of broad spectrum spf 30+ sunscreen to sun-exposed areas.  - Check nails when remove polish.   INSECT BITE REACTION Exam: edematous pink papule with surrounding edema at right lateral ankle.   Treatment Plan: Benign, observe.   Recommend using Clobetasol  ointment 2 times daily to affected area until resolved. Patient has at home.     POST-INFLAMMATORY HYPOPIGMENTATION (PIH), secondary to guttate psoriasis which has resolved.  Exam: hyperpigmented macules and/or patches at torso   Post-inflammatory hyperpimentation (PIH)  is a benign condition that comes from having previous inflammation in the skin and will fade with time over months to sometimes years. Recommend daily sun protection including sunscreen SPF 30+ to sun-exposed areas. - Recommend treating any itchy or red areas on the skin quickly to prevent new areas of PIH. Once rash has cleared, treating with prescription medicines such as hydroquinone may help fade dark spots faster.    Treatment Plan:  Observe     NEOPLASM OF SKIN (3) Right posterior shoulder Skin / nail biopsy Type of biopsy: tangential   Informed consent: discussed and consent obtained   Timeout: patient name, date of birth, surgical site, and procedure verified   Procedure prep:  Patient was prepped and draped in usual sterile fashion Prep type:  Isopropyl alcohol   Anesthesia: the lesion was anesthetized in a standard fashion   Anesthetic:  1% lidocaine  w/ epinephrine 1-100,000 buffered w/ 8.4% NaHCO3 Instrument used: DermaBlade   Hemostasis achieved with:  pressure and aluminum chloride   Outcome: patient tolerated procedure well   Post-procedure details: sterile dressing applied and wound care instructions given   Dressing type: bandage and petrolatum    Specimen 1 - Surgical pathology Differential Diagnosis: Lentigo vs lentigo maligna  Check Margins: No lower spinal back Skin / nail biopsy Type of biopsy: tangential   Informed consent: discussed and consent obtained   Timeout: patient name, date of birth, surgical site, and procedure verified   Procedure prep:  Patient was prepped and draped in usual sterile fashion Prep type:  Isopropyl alcohol  Anesthesia: the lesion was anesthetized in a standard fashion   Anesthetic:  1% lidocaine  w/ epinephrine 1-100,000 buffered w/ 8.4% NaHCO3 Instrument used: DermaBlade   Hemostasis achieved with: pressure and aluminum chloride   Outcome: patient tolerated procedure well   Post-procedure details: sterile dressing applied and wound care instructions given   Dressing type: bandage and petrolatum    Specimen 2 - Surgical pathology Differential Diagnosis: Lentigo vs lentigo maligna  Check Margins: No Right Triangular Fossa Patient defers biopsy of lesion at ear today. Will biopsy at next visit.  Return in about 1 year (around 07/29/2025) for TBSE.  I, Kate Fought, CMA, am acting as scribe for Boneta Sharps, MD.   Documentation: I have reviewed the above documentation for accuracy and completeness, and I agree with the above.  Boneta Sharps, MD

## 2024-07-29 NOTE — Patient Instructions (Addendum)
 Insect Bite:  Recommend using Clobetasol  ointment 2 times daily to affected area until resolved.       Wound Care Instructions  Cleanse wound gently with soap and water once a day then pat dry with clean gauze. Apply a thin coat of Petrolatum (petroleum jelly, Vaseline) over the wound (unless you have an allergy to this). We recommend that you use a new, sterile tube of Vaseline. Do not pick or remove scabs. Do not remove the yellow or white healing tissue from the base of the wound.  Cover the wound with fresh, clean, nonstick gauze and secure with paper tape. You may use Band-Aids in place of gauze and tape if the wound is small enough, but would recommend trimming much of the tape off as there is often too much. Sometimes Band-Aids can irritate the skin.  You should call the office for your biopsy report after 1 week if you have not already been contacted.  If you experience any problems, such as abnormal amounts of bleeding, swelling, significant bruising, significant pain, or evidence of infection, please call the office immediately.  FOR ADULT SURGERY PATIENTS: If you need something for pain relief you may take 1 extra strength Tylenol  (acetaminophen ) AND 2 Ibuprofen  (200mg  each) together every 4 hours as needed for pain. (do not take these if you are allergic to them or if you have a reason you should not take them.) Typically, you may only need pain medication for 1 to 3 days.         Recommend daily broad spectrum sunscreen SPF 30+ to sun-exposed areas, reapply every 2 hours as needed. Call for new or changing lesions.  Staying in the shade or wearing long sleeves, sun glasses (UVA+UVB protection) and wide brim hats (4-inch brim around the entire circumference of the hat) are also recommended for sun protection.     Melanoma ABCDEs  Melanoma is the most dangerous type of skin cancer, and is the leading cause of death from skin disease.  You are more likely to develop  melanoma if you: Have light-colored skin, light-colored eyes, or red or blond hair Spend a lot of time in the sun Tan regularly, either outdoors or in a tanning bed Have had blistering sunburns, especially during childhood Have a close family member who has had a melanoma Have atypical moles or large birthmarks  Early detection of melanoma is key since treatment is typically straightforward and cure rates are extremely high if we catch it early.   The first sign of melanoma is often a change in a mole or a new dark spot.  The ABCDE system is a way of remembering the signs of melanoma.  A for asymmetry:  The two halves do not match. B for border:  The edges of the growth are irregular. C for color:  A mixture of colors are present instead of an even brown color. D for diameter:  Melanomas are usually (but not always) greater than 6mm - the size of a pencil eraser. E for evolution:  The spot keeps changing in size, shape, and color.  Please check your skin once per month between visits. You can use a small mirror in front and a large mirror behind you to keep an eye on the back side or your body.   If you see any new or changing lesions before your next follow-up, please call to schedule a visit.  Please continue daily skin protection including broad spectrum sunscreen SPF 30+ to sun-exposed areas,  reapplying every 2 hours as needed when you're outdoors.   Staying in the shade or wearing long sleeves, sun glasses (UVA+UVB protection) and wide brim hats (4-inch brim around the entire circumference of the hat) are also recommended for sun protection.     Due to recent changes in healthcare laws, you may see results of your pathology and/or laboratory studies on MyChart before the doctors have had a chance to review them. We understand that in some cases there may be results that are confusing or concerning to you. Please understand that not all results are received at the same time and often  the doctors may need to interpret multiple results in order to provide you with the best plan of care or course of treatment. Therefore, we ask that you please give us  2 business days to thoroughly review all your results before contacting the office for clarification. Should we see a critical lab result, you will be contacted sooner.   If You Need Anything After Your Visit  If you have any questions or concerns for your doctor, please call our main line at [phone] and press option 4 to reach your doctor's medical assistant. If no one answers, please leave a voicemail as directed and we will return your call as soon as possible. Messages left after 4 pm will be answered the following business day.   You may also send us  a message via MyChart. We typically respond to MyChart messages within 1-2 business days.  For prescription refills, please ask your pharmacy to contact our office. Our fax number is [phone].  If you have an urgent issue when the clinic is closed that cannot wait until the next business day, you can page your doctor at the number below.    Please note that while we do our best to be available for urgent issues outside of office hours, we are not available 24/7.   If you have an urgent issue and are unable to reach us , you may choose to seek medical care at your doctor's office, retail clinic, urgent care center, or emergency room.  If you have a medical emergency, please immediately call 911 or go to the emergency department.  Pager Numbers  - Dr. Hester: [phone]  - Dr. Jackquline: [phone]  - Dr. Claudene: [phone]   - Dr. Raymund: [phone]  In the event of inclement weather, please call our main line at [phone] for an update on the status of any delays or closures.  Dermatology Medication Tips: Please keep the boxes that topical medications come in in order to help keep track of the instructions about where and how to use these. Pharmacies  typically print the medication instructions only on the boxes and not directly on the medication tubes.   If your medication is too expensive, please contact our office at [phone] option 4 or send us  a message through MyChart.   We are unable to tell what your co-pay for medications will be in advance as this is different depending on your insurance coverage. However, we may be able to find a substitute medication at lower cost or fill out paperwork to get insurance to cover a needed medication.   If a prior authorization is required to get your medication covered by your insurance company, please allow us  1-2 business days to complete this process.  Drug prices often vary depending on where the prescription is filled and some pharmacies may offer cheaper prices.  The website www.goodrx.com contains coupons for medications  through different pharmacies. The prices here do not account for what the cost may be with help from insurance (it may be cheaper with your insurance), but the website can give you the price if you did not use any insurance.  - You can print the associated coupon and take it with your prescription to the pharmacy.  - You may also stop by our office during regular business hours and pick up a GoodRx coupon card.  - If you need your prescription sent electronically to a different pharmacy, notify our office through [facility] or by phone at [phone] option 4.     Si Usted Necesita Algo Despus de Su Visita  Tambin puede enviarnos un mensaje a travs de Clinical cytogeneticist. Por lo general respondemos a los mensajes de MyChart en el transcurso de 1 a 2 das hbiles.  Para renovar recetas, por favor pida a su farmacia que se ponga en contacto con nuestra oficina. Randi lakes de fax es [place] [phone].  Si tiene un asunto urgente cuando la clnica est cerrada y que no puede esperar hasta el siguiente da hbil, puede llamar/localizar a su doctor(a) al nmero que  aparece a continuacin.   Por favor, tenga en cuenta que aunque hacemos todo lo posible para estar disponibles para asuntos urgentes fuera del horario de [place], no estamos disponibles las 24 horas del da, los 7 [address]  [address] de la [place].   Si tiene un problema urgente y no puede comunicarse con nosotros, puede optar por buscar atencin mdica  en el consultorio de su doctor(a), en una clnica privada, en un centro de atencin urgente o en una sala de emergencias.  Si tiene Engineer, drilling, por favor llame inmediatamente al 911 o vaya a la sala de emergencias.  Nmeros de bper  - Dr. Hester: [phone]  - Dra. Jackquline: [phone]  - Dr. Claudene: [phone]  - Dra. Kitts: [phone]  En caso de inclemencias del [place], por favor llame a nuestra lnea principal al [phone] para una actualizacin sobre el estado de cualquier retraso o cierre.  Consejos para la medicacin en dermatologa: Por favor, guarde las cajas en las que vienen los medicamentos de uso tpico para ayudarle a seguir las instrucciones sobre dnde y cmo usarlos. Las farmacias generalmente imprimen las instrucciones del medicamento slo en las cajas y no directamente en los tubos del [place].   Si su medicamento es muy caro, por favor, pngase en contacto con landry rieger llamando al [phone] y presione la opcin 4 o envenos un mensaje a travs de Clinical cytogeneticist.   No podemos decirle cul ser su copago por los medicamentos por adelantado ya que esto es diferente dependiendo de la cobertura de su seguro. Sin embargo, es posible que podamos encontrar un medicamento sustituto a Audiological scientist un formulario para que el seguro cubra el medicamento que se considera necesario.   Si se requiere una autorizacin previa para que su compaa de seguros malta su medicamento, por favor permtanos de 1 a 2 das hbiles para completar este proceso.  Los precios de los medicamentos varan con frecuencia  dependiendo del Environmental consultant de dnde se surte la receta y alguna farmacias pueden ofrecer precios ms baratos.  El sitio web www.goodrx.com tiene cupones para medicamentos de Health and safety inspector. Los precios aqu no tienen en cuenta lo que podra costar con la ayuda del seguro (puede ser ms barato con su seguro), pero el sitio web puede darle el precio si no utiliz Tourist information centre manager.  - Puede imprimir  el cupn correspondiente y llevarlo con su receta a la farmacia.  - Tambin puede pasar por nuestra oficina durante el horario de atencin regular y Education officer, museum una tarjeta de cupones de GoodRx.  - Si necesita que su receta se enve electrnicamente a una farmacia diferente, informe a nuestra oficina a travs de MyChart de [place] o por telfono llamando al [phone] y presione la opcin 4.

## 2024-07-31 ENCOUNTER — Other Ambulatory Visit: Payer: Self-pay

## 2024-07-31 ENCOUNTER — Ambulatory Visit: Payer: Self-pay | Admitting: Dermatology

## 2024-07-31 LAB — SURGICAL PATHOLOGY

## 2024-07-31 MED ORDER — HYDROCORTISONE 2.5 % EX CREA
1.0000 | TOPICAL_CREAM | Freq: Two times a day (BID) | CUTANEOUS | 0 refills | Status: AC
  Filled 2024-07-31: qty 30, 10d supply, fill #0

## 2024-08-01 ENCOUNTER — Other Ambulatory Visit: Payer: Self-pay

## 2024-08-01 MED ORDER — PHENDIMETRAZINE TARTRATE 35 MG PO TABS
35.0000 mg | ORAL_TABLET | Freq: Three times a day (TID) | ORAL | 2 refills | Status: AC
  Filled 2024-08-01: qty 90, 30d supply, fill #0
  Filled 2024-08-29: qty 90, 30d supply, fill #1

## 2024-08-01 MED ORDER — AZELASTINE HCL 0.1 % NA SOLN
1.0000 | Freq: Two times a day (BID) | NASAL | 2 refills | Status: AC
  Filled 2024-08-01: qty 30, 60d supply, fill #0

## 2024-08-01 MED ORDER — FLUTICASONE PROPIONATE 50 MCG/ACT NA SUSP
2.0000 | Freq: Every day | NASAL | 2 refills | Status: AC
  Filled 2024-08-01: qty 16, 30d supply, fill #0

## 2024-08-04 ENCOUNTER — Encounter: Payer: Self-pay | Admitting: Dermatology

## 2024-08-04 NOTE — Telephone Encounter (Signed)
-----   Message from [facility] sent at 07/31/2024  5:54 PM EDT ----- Diagnosis: 1. Skin, right posterior shoulder :       DYSPLASTIC COMPOUND NEVUS WITH MODERATE ATYPIA, LIMITED MARGINS FREE        2. Skin, lower spinal back :       DYSPLASTIC COMPOUND NEVUS WITH MODERATE ATYPIA, CLOSE TO MARGIN    Plan: sent mychart ----- Message ----- From: Interface, Lab [facility] Sent: 07/31/2024   5:24 PM EDT To: Boneta Sharps, MD

## 2024-08-04 NOTE — Telephone Encounter (Signed)
 MyChart message sent to patient.

## 2024-08-27 ENCOUNTER — Encounter: Payer: Self-pay | Admitting: Pharmacist

## 2024-08-27 ENCOUNTER — Other Ambulatory Visit: Payer: Self-pay

## 2024-08-27 MED ORDER — ESOMEPRAZOLE MAGNESIUM 40 MG PO CPDR
40.0000 mg | DELAYED_RELEASE_CAPSULE | Freq: Two times a day (BID) | ORAL | 1 refills | Status: AC
  Filled 2024-08-27 – 2024-09-02 (×3): qty 180, 90d supply, fill #0

## 2024-08-28 ENCOUNTER — Other Ambulatory Visit: Payer: Self-pay

## 2024-08-28 MED ORDER — PHENDIMETRAZINE TARTRATE 35 MG PO TABS: 35.0000 mg | ORAL_TABLET | Freq: Three times a day (TID) | ORAL | 2 refills | Status: AC

## 2024-08-29 ENCOUNTER — Other Ambulatory Visit: Payer: Self-pay

## 2024-09-02 ENCOUNTER — Other Ambulatory Visit (HOSPITAL_COMMUNITY): Payer: Self-pay

## 2024-09-02 ENCOUNTER — Other Ambulatory Visit: Payer: Self-pay

## 2024-09-08 ENCOUNTER — Other Ambulatory Visit: Payer: Self-pay

## 2024-09-24 ENCOUNTER — Other Ambulatory Visit: Payer: Self-pay

## 2024-10-08 ENCOUNTER — Other Ambulatory Visit: Payer: Self-pay

## 2024-10-08 DIAGNOSIS — M778 Other enthesopathies, not elsewhere classified: Secondary | ICD-10-CM | POA: Diagnosis not present

## 2024-10-09 ENCOUNTER — Other Ambulatory Visit: Payer: Self-pay

## 2024-10-09 MED ORDER — GLIPIZIDE 10 MG PO TABS
10.0000 mg | ORAL_TABLET | Freq: Two times a day (BID) | ORAL | 1 refills | Status: AC
  Filled 2024-10-09 – 2024-10-28 (×2): qty 180, 90d supply, fill #0

## 2024-10-09 MED ORDER — ESOMEPRAZOLE MAGNESIUM 40 MG PO CPDR
40.0000 mg | DELAYED_RELEASE_CAPSULE | Freq: Two times a day (BID) | ORAL | 1 refills | Status: AC
  Filled 2024-11-17 (×2): qty 180, 90d supply, fill #0

## 2024-10-09 MED ORDER — METFORMIN HCL 1000 MG PO TABS
1000.0000 mg | ORAL_TABLET | Freq: Two times a day (BID) | ORAL | 1 refills | Status: AC
  Filled 2024-10-09 – 2024-10-28 (×2): qty 180, 90d supply, fill #0

## 2024-10-09 MED ORDER — ONDANSETRON HCL 4 MG PO TABS
4.0000 mg | ORAL_TABLET | Freq: Two times a day (BID) | ORAL | 1 refills | Status: AC | PRN
  Filled 2024-10-09 – 2024-10-28 (×2): qty 30, 15d supply, fill #0

## 2024-10-09 MED ORDER — JARDIANCE 10 MG PO TABS
10.0000 mg | ORAL_TABLET | Freq: Every day | ORAL | 1 refills | Status: AC
  Filled 2024-10-09 – 2024-10-22 (×2): qty 90, 90d supply, fill #0

## 2024-10-10 ENCOUNTER — Other Ambulatory Visit: Payer: Self-pay

## 2024-10-10 MED ORDER — PHENDIMETRAZINE TARTRATE 35 MG PO TABS
35.0000 mg | ORAL_TABLET | Freq: Three times a day (TID) | ORAL | 2 refills | Status: AC
  Filled 2024-10-10 – 2024-10-28 (×2): qty 90, 30d supply, fill #0

## 2024-10-20 ENCOUNTER — Other Ambulatory Visit: Payer: Self-pay

## 2024-10-22 ENCOUNTER — Other Ambulatory Visit: Payer: Self-pay

## 2024-10-28 ENCOUNTER — Other Ambulatory Visit: Payer: Self-pay

## 2024-11-10 ENCOUNTER — Other Ambulatory Visit: Payer: Self-pay

## 2024-11-10 MED ORDER — OZEMPIC (2 MG/DOSE) 8 MG/3ML ~~LOC~~ SOPN
0.7500 mL | PEN_INJECTOR | SUBCUTANEOUS | 5 refills | Status: AC
  Filled 2024-11-10: qty 3, 28d supply, fill #0
  Filled 2024-11-10: qty 9, 84d supply, fill #0

## 2024-11-11 ENCOUNTER — Other Ambulatory Visit: Payer: Self-pay

## 2024-11-12 ENCOUNTER — Other Ambulatory Visit: Payer: Self-pay

## 2024-11-17 ENCOUNTER — Other Ambulatory Visit: Payer: Self-pay

## 2024-11-26 ENCOUNTER — Other Ambulatory Visit: Payer: Self-pay | Admitting: Internal Medicine

## 2024-11-26 DIAGNOSIS — K219 Gastro-esophageal reflux disease without esophagitis: Secondary | ICD-10-CM

## 2024-12-02 ENCOUNTER — Other Ambulatory Visit: Payer: Self-pay

## 2024-12-02 MED ORDER — ESOMEPRAZOLE MAGNESIUM 40 MG PO CPDR
40.0000 mg | DELAYED_RELEASE_CAPSULE | Freq: Two times a day (BID) | ORAL | 1 refills | Status: AC
  Filled 2024-12-02: qty 180, 90d supply, fill #0

## 2024-12-02 MED ORDER — ATORVASTATIN CALCIUM 10 MG PO TABS
10.0000 mg | ORAL_TABLET | Freq: Every day | ORAL | 1 refills | Status: AC
  Filled 2024-12-02 – 2024-12-03 (×2): qty 90, 90d supply, fill #0

## 2024-12-02 MED ORDER — HYDROCORTISONE 2.5 % EX CREA
1.0000 | TOPICAL_CREAM | Freq: Two times a day (BID) | CUTANEOUS | 0 refills | Status: AC
  Filled 2024-12-02 – 2024-12-03 (×2): qty 20, 7d supply, fill #0

## 2024-12-02 MED ORDER — JARDIANCE 10 MG PO TABS
10.0000 mg | ORAL_TABLET | Freq: Every day | ORAL | 1 refills | Status: AC
  Filled 2024-12-02: qty 90, 90d supply, fill #0

## 2024-12-02 MED ORDER — OZEMPIC (2 MG/DOSE) 8 MG/3ML ~~LOC~~ SOPN
2.0000 mg | PEN_INJECTOR | SUBCUTANEOUS | 5 refills | Status: AC
  Filled 2024-12-02 – 2024-12-03 (×2): qty 3, 28d supply, fill #0
  Filled ????-??-??: fill #0

## 2024-12-03 ENCOUNTER — Other Ambulatory Visit: Payer: Self-pay

## 2024-12-03 ENCOUNTER — Other Ambulatory Visit (HOSPITAL_COMMUNITY): Payer: Self-pay

## 2024-12-04 ENCOUNTER — Other Ambulatory Visit: Payer: Self-pay

## 2024-12-05 ENCOUNTER — Other Ambulatory Visit: Payer: Self-pay

## 2024-12-05 ENCOUNTER — Ambulatory Visit: Admission: RE | Admit: 2024-12-05

## 2024-12-08 ENCOUNTER — Other Ambulatory Visit (HOSPITAL_COMMUNITY): Payer: Self-pay

## 2024-12-08 ENCOUNTER — Other Ambulatory Visit: Payer: Self-pay

## 2024-12-08 MED ORDER — PHENDIMETRAZINE TARTRATE 35 MG PO TABS
35.0000 mg | ORAL_TABLET | Freq: Three times a day (TID) | ORAL | 2 refills | Status: AC
  Filled 2024-12-08: qty 90, 30d supply, fill #0

## 2024-12-09 ENCOUNTER — Other Ambulatory Visit: Payer: Self-pay

## 2025-07-30 ENCOUNTER — Encounter: Admitting: Dermatology
